# Patient Record
Sex: Female | Born: 1940
Health system: Southern US, Community
[De-identification: ages and names within clinical notes are randomized; demographics above are authoritative.]

## PROBLEM LIST (undated history)

## (undated) DIAGNOSIS — I82409 Acute embolism and thrombosis of unspecified deep veins of unspecified lower extremity: Secondary | ICD-10-CM

## (undated) DIAGNOSIS — F411 Generalized anxiety disorder: Secondary | ICD-10-CM

## (undated) DIAGNOSIS — E785 Hyperlipidemia, unspecified: Secondary | ICD-10-CM

## (undated) DIAGNOSIS — K635 Polyp of colon: Secondary | ICD-10-CM

## (undated) DIAGNOSIS — I10 Essential (primary) hypertension: Secondary | ICD-10-CM

## (undated) DIAGNOSIS — R0602 Shortness of breath: Secondary | ICD-10-CM

## (undated) DIAGNOSIS — D649 Anemia, unspecified: Secondary | ICD-10-CM

## (undated) DIAGNOSIS — J209 Acute bronchitis, unspecified: Secondary | ICD-10-CM

## (undated) DIAGNOSIS — D72828 Other elevated white blood cell count: Principal | ICD-10-CM

## (undated) HISTORY — DX: Acute bronchitis, unspecified: J20.9

## (undated) HISTORY — DX: Anemia, unspecified: D64.9

## (undated) HISTORY — PX: BREAST BIOPSY: SHX20

## (undated) HISTORY — DX: Generalized anxiety disorder: F41.1

## (undated) HISTORY — DX: Hyperlipidemia, unspecified: E78.5

## (undated) HISTORY — PX: CHOLECYSTECTOMY: SHX55

## (undated) HISTORY — PX: FOOT SURGERY: SHX648

## (undated) HISTORY — DX: Shortness of breath: R06.02

## (undated) HISTORY — DX: Polyp of colon: K63.5

## (undated) HISTORY — PX: HIATAL HERNIA REPAIR: SHX195

## (undated) HISTORY — DX: Essential (primary) hypertension: I10

## (undated) HISTORY — DX: Other elevated white blood cell count: D72.828

## (undated) HISTORY — DX: Acute embolism and thrombosis of unspecified deep veins of unspecified lower extremity: I82.409

---

## 1998-09-11 ENCOUNTER — Ambulatory Visit (HOSPITAL_COMMUNITY): Admission: RE | Admit: 1998-09-11 | Discharge: 1998-09-11 | Payer: Self-pay | Admitting: *Deleted

## 2000-10-07 ENCOUNTER — Inpatient Hospital Stay (HOSPITAL_COMMUNITY): Admission: EM | Admit: 2000-10-07 | Discharge: 2000-10-09 | Payer: Self-pay | Admitting: Emergency Medicine

## 2000-10-10 ENCOUNTER — Encounter: Payer: Self-pay | Admitting: Emergency Medicine

## 2000-10-10 ENCOUNTER — Emergency Department (HOSPITAL_COMMUNITY): Admission: EM | Admit: 2000-10-10 | Discharge: 2000-10-11 | Payer: Self-pay | Admitting: Emergency Medicine

## 2000-10-16 ENCOUNTER — Encounter: Payer: Self-pay | Admitting: Emergency Medicine

## 2000-10-17 ENCOUNTER — Inpatient Hospital Stay (HOSPITAL_COMMUNITY): Admission: EM | Admit: 2000-10-17 | Discharge: 2000-10-21 | Payer: Self-pay | Admitting: Emergency Medicine

## 2000-11-05 ENCOUNTER — Ambulatory Visit (HOSPITAL_BASED_OUTPATIENT_CLINIC_OR_DEPARTMENT_OTHER): Admission: RE | Admit: 2000-11-05 | Discharge: 2000-11-05 | Payer: Self-pay | Admitting: Internal Medicine

## 2000-11-18 ENCOUNTER — Encounter: Payer: Self-pay | Admitting: Family Medicine

## 2000-11-18 ENCOUNTER — Ambulatory Visit (HOSPITAL_COMMUNITY): Admission: RE | Admit: 2000-11-18 | Discharge: 2000-11-18 | Payer: Self-pay | Admitting: Family Medicine

## 2001-03-26 ENCOUNTER — Ambulatory Visit (HOSPITAL_COMMUNITY): Admission: RE | Admit: 2001-03-26 | Discharge: 2001-03-26 | Payer: Self-pay | Admitting: Internal Medicine

## 2001-04-02 ENCOUNTER — Other Ambulatory Visit: Admission: RE | Admit: 2001-04-02 | Discharge: 2001-04-02 | Payer: Self-pay | Admitting: *Deleted

## 2001-08-04 ENCOUNTER — Other Ambulatory Visit: Admission: RE | Admit: 2001-08-04 | Discharge: 2001-08-04 | Payer: Self-pay | Admitting: Obstetrics and Gynecology

## 2002-06-03 ENCOUNTER — Encounter: Admission: RE | Admit: 2002-06-03 | Discharge: 2002-06-03 | Payer: Self-pay | Admitting: *Deleted

## 2002-06-03 ENCOUNTER — Encounter: Payer: Self-pay | Admitting: *Deleted

## 2002-12-21 ENCOUNTER — Other Ambulatory Visit: Admission: RE | Admit: 2002-12-21 | Discharge: 2002-12-21 | Payer: Self-pay | Admitting: Obstetrics and Gynecology

## 2003-11-23 ENCOUNTER — Emergency Department (HOSPITAL_COMMUNITY): Admission: EM | Admit: 2003-11-23 | Discharge: 2003-11-23 | Payer: Self-pay | Admitting: Emergency Medicine

## 2003-11-23 ENCOUNTER — Inpatient Hospital Stay (HOSPITAL_COMMUNITY): Admission: AD | Admit: 2003-11-23 | Discharge: 2003-12-04 | Payer: Self-pay | Admitting: Radiology

## 2003-11-29 ENCOUNTER — Encounter: Payer: Self-pay | Admitting: Cardiology

## 2003-12-05 ENCOUNTER — Inpatient Hospital Stay (HOSPITAL_COMMUNITY): Admission: EM | Admit: 2003-12-05 | Discharge: 2003-12-09 | Payer: Self-pay

## 2003-12-06 ENCOUNTER — Encounter: Payer: Self-pay | Admitting: Cardiovascular Disease

## 2004-01-30 ENCOUNTER — Other Ambulatory Visit: Admission: RE | Admit: 2004-01-30 | Discharge: 2004-01-30 | Payer: Self-pay | Admitting: Obstetrics and Gynecology

## 2004-05-15 ENCOUNTER — Encounter: Admission: RE | Admit: 2004-05-15 | Discharge: 2004-05-15 | Payer: Self-pay | Admitting: Internal Medicine

## 2004-05-23 ENCOUNTER — Ambulatory Visit (HOSPITAL_COMMUNITY): Admission: RE | Admit: 2004-05-23 | Discharge: 2004-05-23 | Payer: Self-pay | Admitting: Urology

## 2004-07-02 ENCOUNTER — Encounter (HOSPITAL_COMMUNITY): Admission: RE | Admit: 2004-07-02 | Discharge: 2004-08-13 | Payer: Self-pay | Admitting: Endocrinology

## 2005-02-05 ENCOUNTER — Ambulatory Visit: Payer: Self-pay | Admitting: Internal Medicine

## 2005-06-12 ENCOUNTER — Ambulatory Visit: Payer: Self-pay | Admitting: Internal Medicine

## 2005-07-29 ENCOUNTER — Encounter: Admission: RE | Admit: 2005-07-29 | Discharge: 2005-07-29 | Payer: Self-pay | Admitting: Internal Medicine

## 2005-10-30 ENCOUNTER — Encounter: Admission: RE | Admit: 2005-10-30 | Discharge: 2005-10-30 | Payer: Self-pay | Admitting: Endocrinology

## 2005-11-13 ENCOUNTER — Ambulatory Visit: Payer: Self-pay | Admitting: Internal Medicine

## 2005-12-12 ENCOUNTER — Ambulatory Visit: Payer: Self-pay | Admitting: Internal Medicine

## 2006-01-23 ENCOUNTER — Ambulatory Visit: Payer: Self-pay | Admitting: Internal Medicine

## 2006-06-12 ENCOUNTER — Ambulatory Visit: Payer: Self-pay | Admitting: Internal Medicine

## 2006-12-07 ENCOUNTER — Ambulatory Visit: Payer: Self-pay | Admitting: Internal Medicine

## 2007-01-28 ENCOUNTER — Ambulatory Visit: Payer: Self-pay | Admitting: Internal Medicine

## 2007-02-08 ENCOUNTER — Ambulatory Visit (HOSPITAL_COMMUNITY): Admission: RE | Admit: 2007-02-08 | Discharge: 2007-02-08 | Payer: Self-pay | Admitting: Endocrinology

## 2007-02-09 ENCOUNTER — Ambulatory Visit: Payer: Self-pay | Admitting: Critical Care Medicine

## 2007-06-28 ENCOUNTER — Ambulatory Visit: Payer: Self-pay | Admitting: Internal Medicine

## 2007-08-26 DIAGNOSIS — F411 Generalized anxiety disorder: Secondary | ICD-10-CM

## 2007-08-26 DIAGNOSIS — R0602 Shortness of breath: Secondary | ICD-10-CM | POA: Insufficient documentation

## 2007-08-26 DIAGNOSIS — J209 Acute bronchitis, unspecified: Secondary | ICD-10-CM

## 2007-10-26 ENCOUNTER — Ambulatory Visit: Payer: Self-pay | Admitting: Internal Medicine

## 2007-11-18 HISTORY — PX: PARTIAL NEPHRECTOMY: SHX414

## 2007-11-24 ENCOUNTER — Ambulatory Visit (HOSPITAL_COMMUNITY): Admission: RE | Admit: 2007-11-24 | Discharge: 2007-11-24 | Payer: Self-pay | Admitting: Urology

## 2008-02-24 ENCOUNTER — Ambulatory Visit: Payer: Self-pay | Admitting: Internal Medicine

## 2008-05-11 ENCOUNTER — Ambulatory Visit (HOSPITAL_COMMUNITY): Admission: RE | Admit: 2008-05-11 | Discharge: 2008-05-11 | Payer: Self-pay | Admitting: *Deleted

## 2008-08-25 ENCOUNTER — Ambulatory Visit: Payer: Self-pay | Admitting: Internal Medicine

## 2009-04-03 ENCOUNTER — Ambulatory Visit: Payer: Self-pay | Admitting: Internal Medicine

## 2009-04-27 ENCOUNTER — Telehealth: Payer: Self-pay | Admitting: Internal Medicine

## 2009-08-29 ENCOUNTER — Encounter: Payer: Self-pay | Admitting: Adult Health

## 2009-08-29 ENCOUNTER — Ambulatory Visit: Payer: Self-pay | Admitting: Internal Medicine

## 2009-09-12 ENCOUNTER — Ambulatory Visit: Payer: Self-pay | Admitting: Internal Medicine

## 2009-09-16 IMAGING — CR DG BE W/ AIR HIGH DENSITY
15 of 24 series · 15 of 24 positions shown · IV contrast (agent unspecified)
Comparison: none

CLINICAL DATA: Rectal bleeding - incomplete colonoscopy.

AIR-CONTRAST BARIUM ENEMA
TECHNIQUE: After obtaining a scout radiograph air-contrast barium
enema was performed under fluoroscopy using high-density barium.
Fluoroscopy Time: No priors for comparison.
Contrast: T air contrast.

[run (1 of 12)]
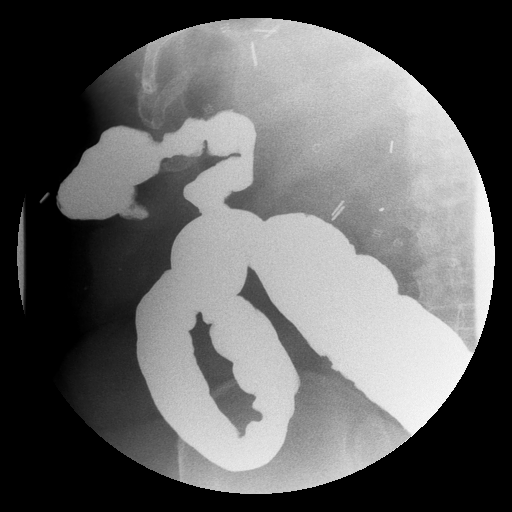

[run (2 of 12)]
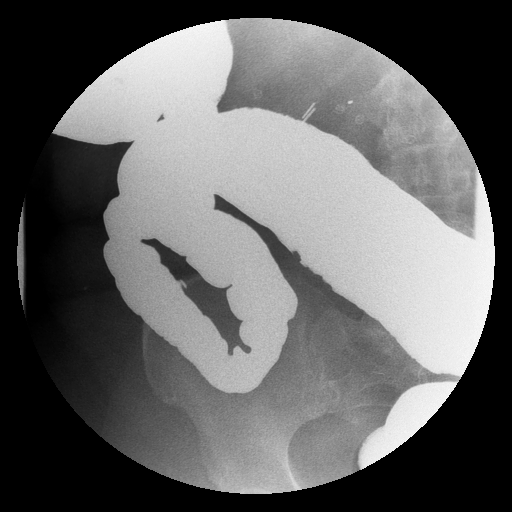

[run (3 of 12)]
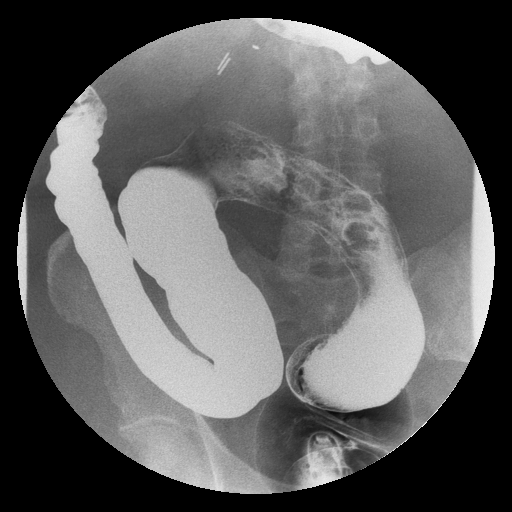

[run (4 of 12)]
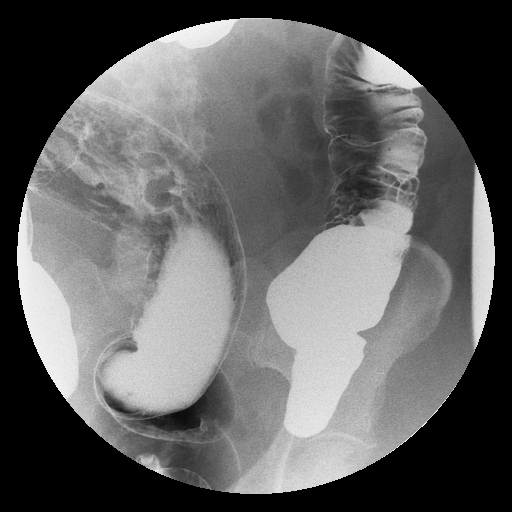

[run (5 of 12)]
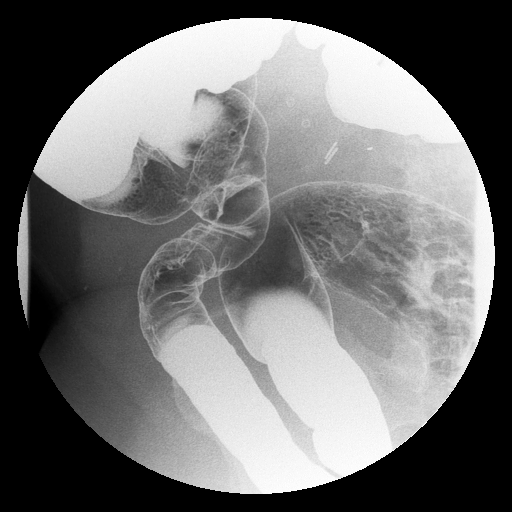

[run (6 of 12)]
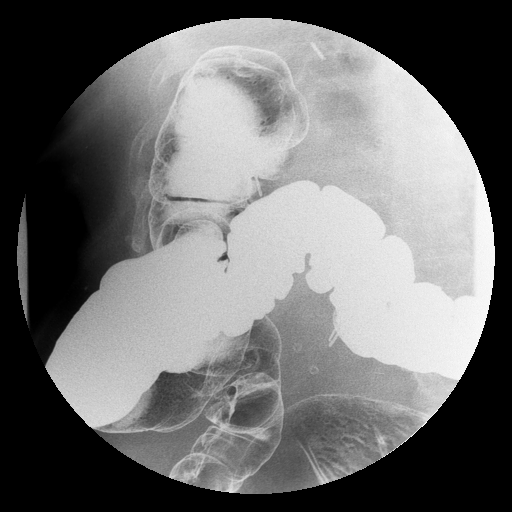

[run (7 of 12)]
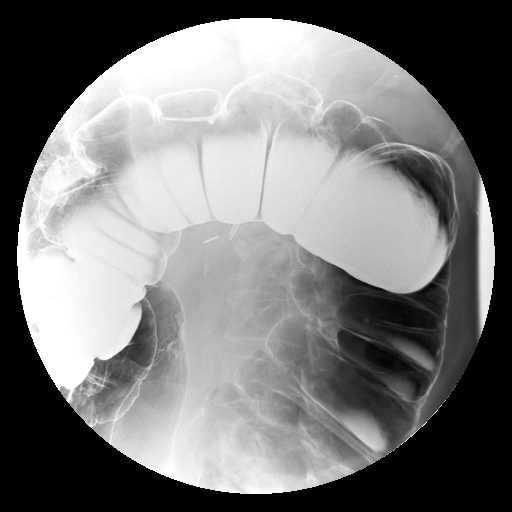

[run (8 of 12)]
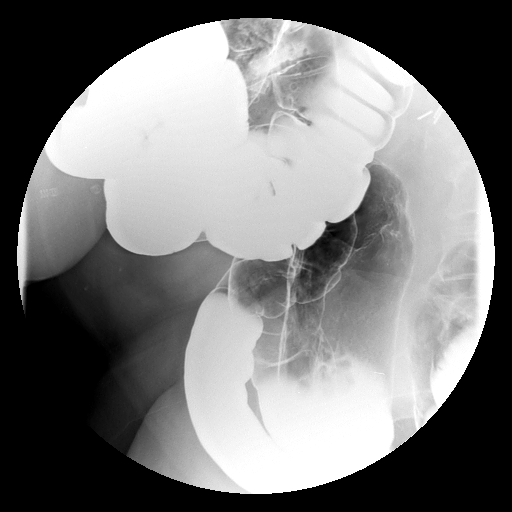

[run (9 of 12)]
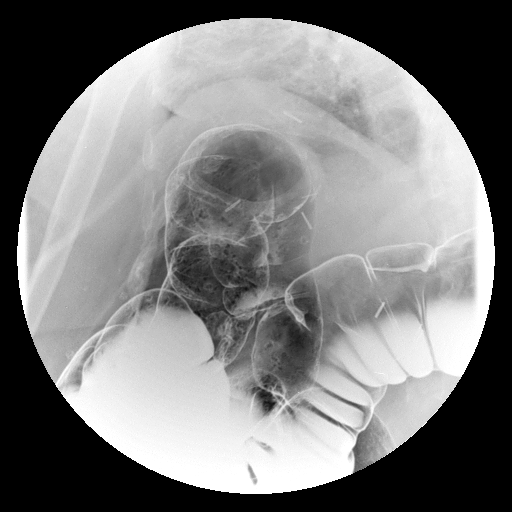

[run (10 of 12)]
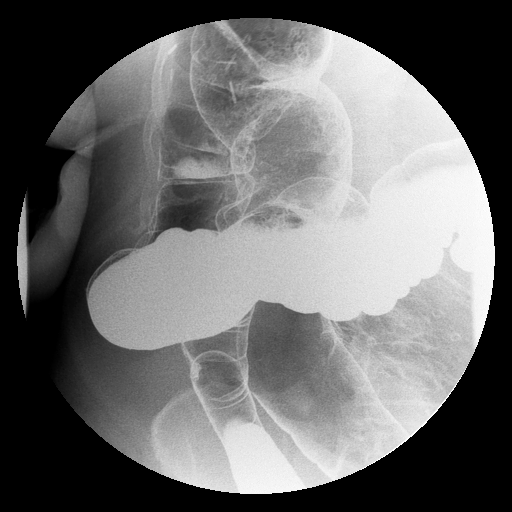

[run (11 of 12)]
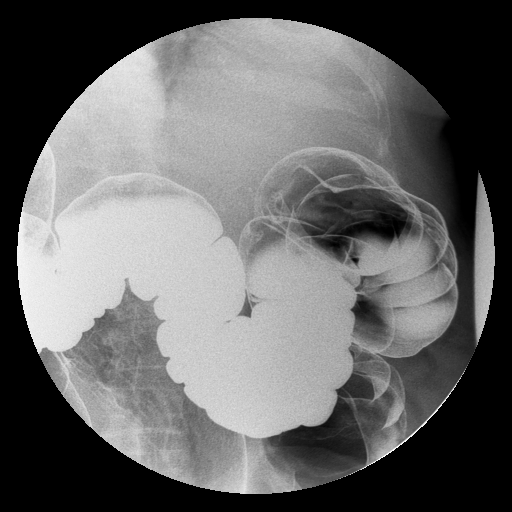

[run (12 of 12)]
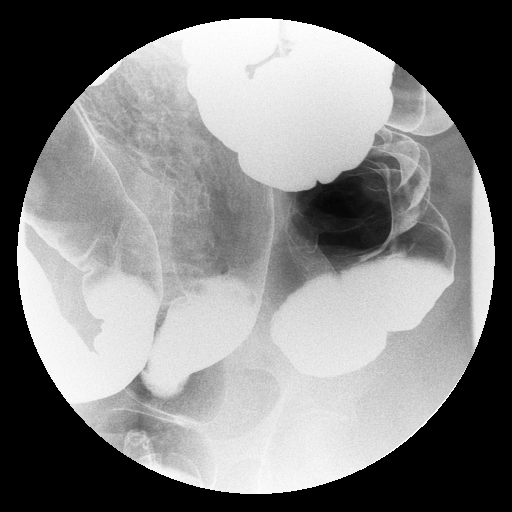

[view not recorded (1 of 3)]
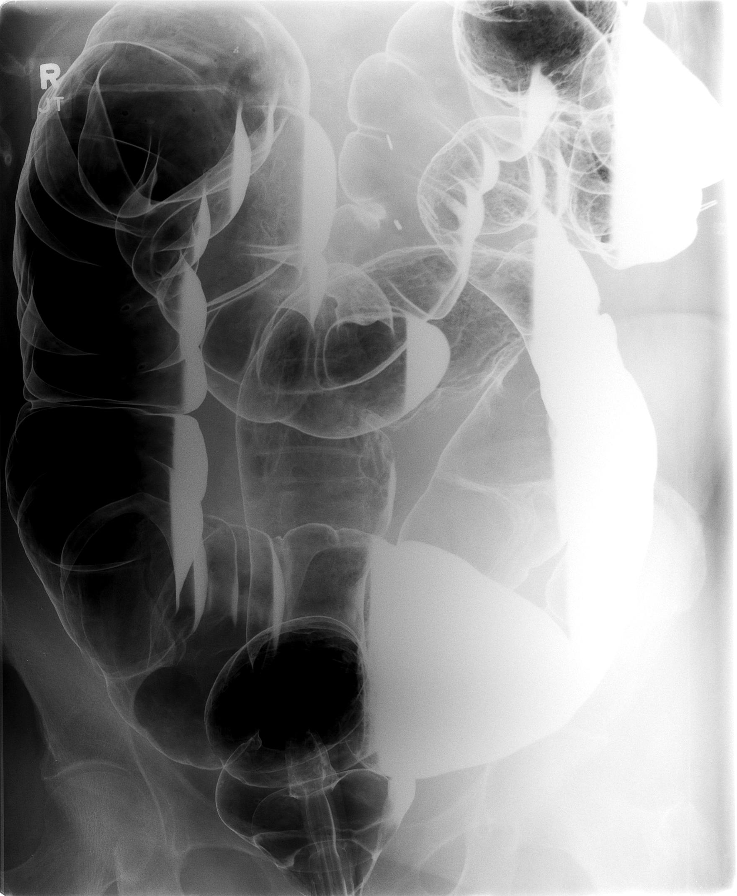

[view not recorded (2 of 3)]
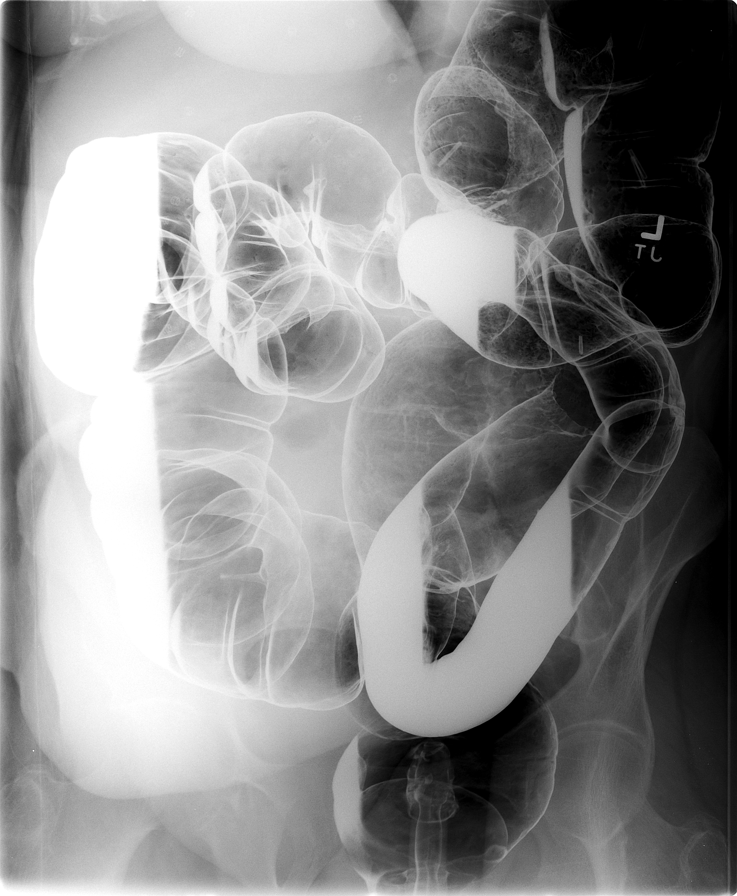

[view not recorded (3 of 3)]
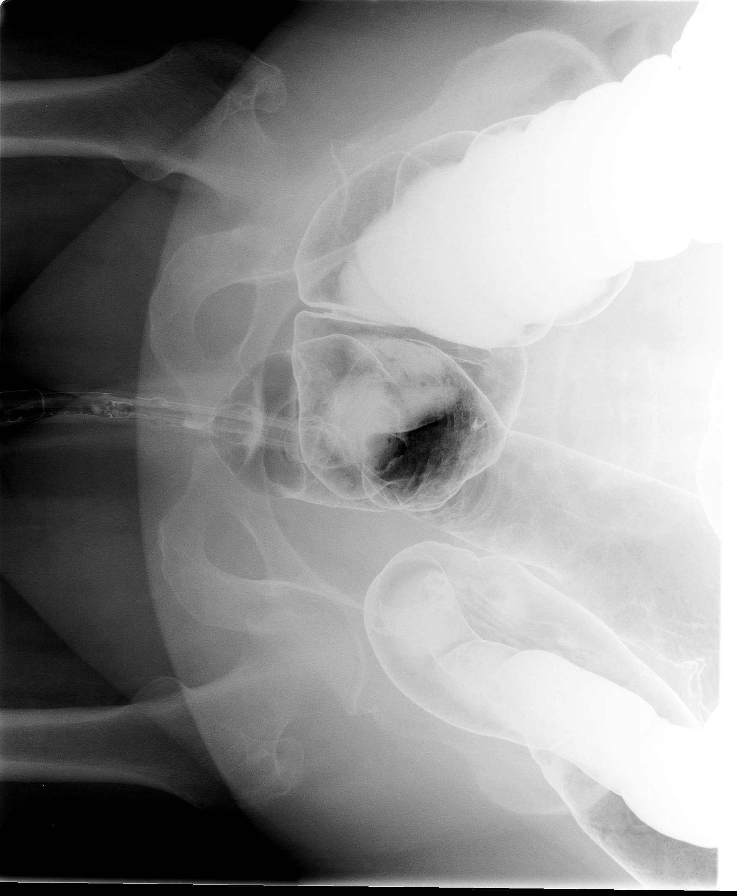

[15 of 24 positions shown; findings below may reference images not displayed]

FINDINGS: Scout film unremarkable except for changes related to
ventral hernia repair in the epigastrium. The left colon is quite
redundant and tortuous.  I was successful in filling the entire
colon with contrast.

The study is limited somewhat by a markedly tortuous colon.
However, on multiple spot films and overhead views, there appears
to be.  Abnormal mucosa in the sigmoid colon, over a long segment
of bowel.  Large polyps and/or mucosal infiltration cannot be
excluded.  This is not appear to be feces but could conceivably be.
Believe abnormality persist on the postevacuation images. I
discussed these findings with Dr. Danii Aujla .

The remainder of the colon is unremarkable, given the limitations
due to the marked tortuosity. There is filling of the appendix.  No
reflux into the terminal ileum.
IMPRESSION: ]
1.  The exam is somewhat limited technically due to a markedly
tortuous colon.
2.  Cannot rule out pathology in the sigmoid colon - see report.

## 2009-10-01 ENCOUNTER — Ambulatory Visit: Payer: Self-pay | Admitting: Internal Medicine

## 2009-11-17 HISTORY — PX: COLONOSCOPY: SHX174

## 2010-04-22 ENCOUNTER — Ambulatory Visit: Payer: Self-pay | Admitting: Internal Medicine

## 2010-05-14 ENCOUNTER — Encounter: Payer: Self-pay | Admitting: Internal Medicine

## 2010-07-24 ENCOUNTER — Telehealth: Payer: Self-pay | Admitting: Internal Medicine

## 2010-09-26 ENCOUNTER — Ambulatory Visit: Payer: Self-pay | Admitting: Internal Medicine

## 2010-11-12 ENCOUNTER — Ambulatory Visit (HOSPITAL_COMMUNITY)
Admission: RE | Admit: 2010-11-12 | Discharge: 2010-11-12 | Payer: Self-pay | Source: Home / Self Care | Attending: Obstetrics and Gynecology | Admitting: Obstetrics and Gynecology

## 2010-12-19 NOTE — Assessment & Plan Note (Signed)
Summary: rov 6 months///kp   Primary Provider/Referring Provider:  Dr Juleen China  CC:  6 month follow up  , increase sob with exertion, and increase swelling in ankles.  History of Present Illness:  August 29, 2009--Presents for an acute office visit. Complains of 2 weeks of progressively worsened cough, congestion w/ thick yellow mucus, Hard to get up, Wheezing is getting worse. No otc used. Denies chest pain,  orthopnea, hemoptysis, fever, n/v/d, edema, headache.  September 12, 2009--Acute Visit.  Pt complains of  "feels like fluid has built back up on lungs."  c/o dry cough, wheezing, inc SOB at rest and with activity, sweating spells, and aching pain in lower back/bilat rib cages.  Pt states sxs started x1wk ago. Denies chest pain,  orthopnea, hemoptysis, fever, n/v/d,  headache.   October 01, 2009-  Asthmatic bronchitis, hx DVT, hx fundoplication, DM She feels well now aftrer acute visit late October. Had flu vax. Spits up a little yellow, but otherwise not wheezing and no significant cough. Rare "streak of fire" chest pain across upper anterior chest, no pattern recognized.  05-06-2010- Asthmatic bronchitis, hx DVT, hx fundoplication, DM Hot weather smothers her. Cough is eased by using diuretic. Denies chest pain. Rarely coughs clear mucus. Feet swell .Denies wheeze. CXR last Fall was clear/ NAD.    Preventive Screening-Counseling & Management  Alcohol-Tobacco     Smoking Status: quit > 6 months  Current Medications (verified): 1)  Singulair 10 Mg  Tabs (Montelukast Sodium) .... Take 1 By Mouth Once Daily 2)  Protonix 40 Mg  Pack (Pantoprazole Sodium) .... Take 1 By Mouth Once Daily 3)  Paxil Cr 37.5 Mg  Tb24 (Paroxetine Hcl) .... Take 1 By Mouth Once Daily 4)  Actonel 35 Mg  Tabs (Risedronate Sodium) .... Take 1 By Mouth Weekly 5)  Advair Diskus 100-50 Mcg/dose  Misc (Fluticasone-Salmeterol) .Marland Kitchen.. 1 Puff Two Times A Day and Rinse Mouth 6)  Hydrochlorothiazide 25 Mg  Tabs  (Hydrochlorothiazide) .... Take 1 By Mouth Once Daily 7)  Crestor 5 Mg  Tabs (Rosuvastatin Calcium) .... Take  1 By Mouth Once Daily 8)  Flexeril 10 Mg  Tabs (Cyclobenzaprine Hcl) .... Take 1 By Mouth Once Daily As Needed 9)  Spiriva Handihaler 18 Mcg  Caps (Tiotropium Bromide Monohydrate) .... Inhale Contents of 1 Capsule Once A Day 10)  Lorazepam 0.5 Mg  Tabs (Lorazepam) .... Take 1 By Mouth Three Times A Day As Needed 11)  Adult Aspirin Low Strength 81 Mg  Tbdp (Aspirin) .... Take 1 By Mouth Once Daily 12)  Lasix 20 Mg  Tabs (Furosemide) .... Take 1 By Mouth Once Daily As Needed 13)  Proair Hfa 108 (90 Base) Mcg/act Aers (Albuterol Sulfate) .... 2 Puffs Four Times A Day As Needed 14)  Bl Potassium 99 Mg  Tabs (Potassium) .... Take 1 By Mouth Once Daily  Allergies (verified): No Known Drug Allergies  Past History:  Past Medical History: Last updated: 02/24/2008 * MULTIPLE SOMATIC COMPLAINS * Hx of DEEP VEIN THROMBOSIS Hx of NISSEN FUNDOPLICATION, HX OF (ICD-V15.2) NEPHRECTOMY, HX OF LEFT (ICD-V45.73) ANXIETY DISORDER, GENERALIZED (ICD-300.02) ASTHMATIC BRONCHITIS, ACUTE (ICD-466.0) DYSPNEA (ICD-786.05)  Past Surgical History: Last updated: 10/01/2009 Hiatal hernia repair x 2 Partial left nephrectomy for cancer Cholecystectomy left breast biopsy, beningn foot surgery left chest tube for effusion after hiatal hernia surgery  Family History: Last updated: 2010/05/06 Father- died MI Mother-died CHF Brother- CAD/Stent/CABG/ MI, DM. Died esophageal perforation after endoscopy.  Social History: Last updated:  09/12/2009 Patient states former smoker.    Never married, no children  Risk Factors: Smoking Status: quit > 6 months (04/22/2010)  Family History: Father- died MI Mother-died CHF Brother- CAD/Stent/CABG/ MI, DM. Died esophageal perforation after endoscopy.  Social History: Smoking Status:  quit > 6 months  Review of Systems      See HPI       The patient  complains of shortness of breath with activity and non-productive cough.  The patient denies shortness of breath at rest, productive cough, coughing up blood, chest pain, irregular heartbeats, acid heartburn, indigestion, loss of appetite, weight change, abdominal pain, difficulty swallowing, sore throat, tooth/dental problems, headaches, nasal congestion/difficulty breathing through nose, and sneezing.    Vital Signs:  Patient profile:   70 year old female Height:      62 inches Weight:      207 pounds BMI:     38.00 O2 Sat:      95 % on Room air Pulse rate:   92 / minute BP sitting:   130 / 745  (left arm)  Vitals Entered By: Renold Genta RCP, LPN (April 22, 6294 2:37 PM)  O2 Flow:  Room air CC: 6 month follow up  ,increase sob with exertion, increase swelling in ankles Comments Medications reviewed with patient Renold Genta RCP, LPN  April 22, 2840 2:39 PM    Physical Exam  Additional Exam:  General: A/Ox3; pleasant and cooperative, NAD, obese SKIN: no rash, lesions NODES: no lymphadenopathy HEENT: [place]/AT, EOM- WNL, Conjuctivae- clear, PERRLA, TM-WNL, Nose- clear, Throat- clear and wnl, dentures, Mallampati III NECK: Supple w/ fair ROM, JVD- none, normal carotid impulses w/o bruits Thyroid-  CHEST: Quiet without wheeze, cough or rhonchi HEART: RRR, no m/g/r heard ABDOMEN: soft LKG:MWNU, nl pulses, trace edema. bilateral varices- soft NEURO: Grossly intact to observation      Impression & Recommendations:  Problem # 1:  DYSPNEA (ICD-786.05) Multifactorial dyspnea with components ofobesity hypoventilation, deconditioning, and uncertain component of variable pulmonary edema she describes as responsive to diuretic. We will get PFT  Problem # 2:  Hx of DEEP VENOUS THROMBOPHLEBITIS, HX OF (ICD-V12.52)  We discussed elevation to prevent blood flow stagnation. She denies pain in legs. Her updated medication list for this problem includes:    Adult Aspirin Low Strength 81  Mg Tbdp (Aspirin) .Marland Kitchen... Take 1 by mouth once daily  Other Orders: Est. Patient Level IV (27253)  Patient Instructions: 1)  Please schedule a follow-up appointment in 3 months. 2)  Schedule PFT with 6 MWT

## 2010-12-19 NOTE — Miscellaneous (Signed)
Summary: Orders Update  Clinical Lists Changes  Orders: Added new Service order of No Show NS50 (NS50) - Signed 

## 2010-12-19 NOTE — Miscellaneous (Signed)
Summary: Orders Update pft charges  Clinical Lists Changes  Orders: Added new Service order of Carbon Monoxide diffusing w/capacity (94720) - Signed Added new Service order of Lung Volumes (94240) - Signed Added new Service order of Spirometry (Pre & Post) (94060) - Signed 

## 2010-12-19 NOTE — Assessment & Plan Note (Signed)
Summary: ROV AFTER SMW/PFT ///KP   Primary Provider/Referring Provider:  Dr Juleen China  CC:  follow up visit Slight SOB after tests; review PFT's and .Marland Kitchen  History of Present Illness: September 12, 2009--Acute Visit.  Pt complains of  "feels like fluid has built back up on lungs."  c/o dry cough, wheezing, inc SOB at rest and with activity, sweating spells, and aching pain in lower back/bilat rib cages.  Pt states sxs started x1wk ago. Denies chest pain,  orthopnea, hemoptysis, fever, n/v/d,  headache.   October 01, 2009-  Asthmatic bronchitis, hx DVT, hx fundoplication, DM She feels well now aftrer acute visit late October. Had flu vax. Spits up a little yellow, but otherwise not wheezing and no significant cough. Rare "streak of fire" chest pain across upper anterior chest, no pattern recognized.  April 22, 2010- Asthmatic bronchitis, hx DVT, hx fundoplication, DM Hot weather smothers her. Cough is eased by using diuretic. Denies chest pain. Rarely coughs clear mucus. Feet swell .Denies wheeze. CXR last Fall was clear/ NAD.   September 26, 2010- Asthmatic bronchitis, hx DVT, hx fundoplication, DM Nurse-CC: follow up visit Slight SOB after tests; review PFT's and . Needs flu vax. Only one pneumovax over 10 years ago.  Using Advair once daily  and Sopiriva. Never needs her Proair rescue inhaler. Occasional chest tightnss is relieved by taking her lasix as needed. Denies recent heartburn. Told once she had some heart failure at hospital- unclear when. Fire ant stings recently- shows scabs on legs..  Having evaluation for gyn bleeding.  PFT- WNL - 97%, 97%, 99%  414 meters.    Preventive Screening-Counseling & Management  Alcohol-Tobacco     Smoking Status: quit > 6 months  Comments: Only smoked occasional cigar her grandfather gave her when she was a child. Never smoked cigarettes.   Current Medications (verified): 1)  Singulair 10 Mg  Tabs (Montelukast Sodium) .... Take 1 By  Mouth Once Daily 2)  Protonix 40 Mg  Pack (Pantoprazole Sodium) .... Take 1 By Mouth Once Daily 3)  Paxil Cr 37.5 Mg  Tb24 (Paroxetine Hcl) .... Take 1 By Mouth Once Daily 4)  Actonel 35 Mg  Tabs (Risedronate Sodium) .... Take 1 By Mouth Weekly 5)  Advair Diskus 100-50 Mcg/dose  Misc (Fluticasone-Salmeterol) .Marland Kitchen.. 1 Puff Two Times A Day and Rinse Mouth 6)  Hydrochlorothiazide 25 Mg  Tabs (Hydrochlorothiazide) .... Take 1 By Mouth Once Daily 7)  Crestor 5 Mg  Tabs (Rosuvastatin Calcium) .... Take  1 By Mouth Once Daily 8)  Flexeril 10 Mg  Tabs (Cyclobenzaprine Hcl) .... Take 1 By Mouth Once Daily As Needed 9)  Spiriva Handihaler 18 Mcg  Caps (Tiotropium Bromide Monohydrate) .... Inhale Contents of 1 Capsule Once A Day 10)  Lorazepam 0.5 Mg  Tabs (Lorazepam) .... Take 1 By Mouth Three Times A Day As Needed 11)  Adult Aspirin Low Strength 81 Mg  Tbdp (Aspirin) .... Take 1 By Mouth Once Daily 12)  Lasix 20 Mg  Tabs (Furosemide) .... Take 1 By Mouth Once Daily As Needed 13)  Proair Hfa 108 (90 Base) Mcg/act Aers (Albuterol Sulfate) .... 2 Puffs Four Times A Day As Needed 14)  Bl Potassium 99 Mg  Tabs (Potassium) .... Take 1 By Mouth Once Daily  Allergies (verified): No Known Drug Allergies  Past History:  Past Medical History: Last updated: 02/24/2008 * MULTIPLE SOMATIC COMPLAINS * Hx of DEEP VEIN THROMBOSIS Hx of NISSEN FUNDOPLICATION, HX OF (ICD-V15.2) NEPHRECTOMY, HX OF LEFT (  ICD-V45.73) ANXIETY DISORDER, GENERALIZED (ICD-300.02) ASTHMATIC BRONCHITIS, ACUTE (ICD-466.0) DYSPNEA (ICD-786.05)  Past Surgical History: Last updated: 10/01/2009 Hiatal hernia repair x 2 Partial left nephrectomy for cancer Cholecystectomy left breast biopsy, beningn foot surgery left chest tube for effusion after hiatal hernia surgery  Family History: Last updated: 2010/05/04 Father- died MI Mother-died CHF Brother- CAD/Stent/CABG/ MI, DM. Died esophageal perforation after endoscopy.  Social  History: Last updated: 09/26/2010 Patient states former smoker.    Never married, no children Has dog.  Risk Factors: Smoking Status: quit > 6 months (09/26/2010)  Social History: Patient states former smoker.    Never married, no children Has dog.  Review of Systems      See HPI  The patient denies shortness of breath with activity, shortness of breath at rest, productive cough, non-productive cough, coughing up blood, chest pain, irregular heartbeats, acid heartburn, indigestion, loss of appetite, weight change, abdominal pain, difficulty swallowing, sore throat, tooth/dental problems, headaches, nasal congestion/difficulty breathing through nose, and sneezing.    Vital Signs:  Patient profile:   70 year old female Height:      62 inches Weight:      200 pounds BMI:     36.71 O2 Sat:      100 % on Room air Pulse rate:   95 / minute BP sitting:   108 / 68  (left arm) Cuff size:   large  Vitals Entered By: Reynaldo Minium CMA (September 26, 2010 10:29 AM)  O2 Flow:  Room air CC: follow up visit Slight SOB after tests; review PFT's and .   Physical Exam  Additional Exam:  General: A/Ox3; pleasant and cooperative, NAD, obese SKIN: Multiple fire ant bites on lower legs.  NODES: no lymphadenopathy HEENT: [place]/AT, EOM- WNL, Conjuctivae- clear, PERRLA, TM-WNL, Nose- clear, Throat- clear and wnl, dentures, Mallampati III NECK: Supple w/ fair ROM, JVD- none, normal carotid impulses w/o bruits Thyroid-  CHEST: Quiet without wheeze, cough or rhonchi HEART: RRR, no m/g/r heard ABDOMEN: soft EXB:MWUX, nl pulses, trace edema. bilateral varices- soft. Negative Homan's.  NEURO: Grossly intact to observation      Impression & Recommendations:  Problem # 1:  Hx of DEEP VENOUS THROMBOPHLEBITIS, HX OF (ICD-V12.52)  Heavy legs with negative Homan's, prominent superficial veins. Now covered with what she reports were fire ant stings- scabs w/o inflammation..  Her updated medication  list for this problem includes:    Adult Aspirin Low Strength 81 Mg Tbdp (Aspirin) .Marland Kitchen... Take 1 by mouth once daily  Problem # 2:  DYSPNEA (ICD-786.05)  obesity hypoventilation and intermittent bronchitis. Excellent PFT and 6 minute walk test results in this never smoker. I don't anticipate pulmonary limitations if she should need gyn surgery.   Problem # 3:  Hx of GERD (ICD-530.81) She is not having active reflux symptoms, but i reminded her of basic reflux precautions. She did have fundoplication in the past.  Her updated medication list for this problem includes:    Protonix 40 Mg Pack (Pantoprazole sodium) .Marland Kitchen... Take 1 by mouth once daily  Other Orders: Est. Patient Level III (32440) Influenza Vaccine MCR (10272) Pneumococcal Vaccine (53664) Admin 1st Vaccine (40347)  Patient Instructions: 1)  Flu and pneumovax.  2)  Please schedule a follow-up appointment in 1 year.   Immunizations Administered:  Influenza Vaccine # 1:    Vaccine Type: Fluvax MCR    Site: left deltoid    Mfr: Nov[employer]is    Dose: 0.5 ml    Route: IM  Given by: Reynaldo Minium CMA    Exp. Date: 04/18/2011    Lot #: [id number]    VIS given: 06/11/10 version given September 26, 2010.  Pneumonia Vaccine:    Vaccine Type: Pneumovax (Medicare)    Site: right deltoid    Mfr: Merck    Dose: 0.5 ml    Route: IM    Given by: Reynaldo Minium CMA    Exp. Date: 03/13/2012    Lot #: [id number]    VIS given: 10/22/09 version given September 26, 2010.  Flu Vaccine Consent Questions:    Do you have a history of severe allergic reactions to this vaccine? no    Any prior history of allergic reactions to egg and/or gelatin? no    Do you have a sensitivity to the preservative Thimersol? no    Do you have a past history of Guillan-Barre Syndrome? no    Do you currently have an acute febrile illness? no    Have you ever had a severe reaction to latex? no    Vaccine information given and explained to patient? yes    Are you  currently pregnant? no

## 2010-12-19 NOTE — Assessment & Plan Note (Signed)
Summary: SIX MIN WALK- PULM STRESS TEST  Nurse Visit   Vital Signs:  Patient profile:   70 year old female Pulse rate:   78 / minute BP sitting:   124 / 68  Medications Prior to Update: 1)  Singulair 10 Mg  Tabs (Montelukast Sodium) .... Take 1 By Mouth Once Daily 2)  Protonix 40 Mg  Pack (Pantoprazole Sodium) .... Take 1 By Mouth Once Daily 3)  Paxil Cr 37.5 Mg  Tb24 (Paroxetine Hcl) .... Take 1 By Mouth Once Daily 4)  Actonel 35 Mg  Tabs (Risedronate Sodium) .... Take 1 By Mouth Weekly 5)  Advair Diskus 100-50 Mcg/dose  Misc (Fluticasone-Salmeterol) .Marland Kitchen.. 1 Puff Two Times A Day and Rinse Mouth 6)  Hydrochlorothiazide 25 Mg  Tabs (Hydrochlorothiazide) .... Take 1 By Mouth Once Daily 7)  Crestor 5 Mg  Tabs (Rosuvastatin Calcium) .... Take  1 By Mouth Once Daily 8)  Flexeril 10 Mg  Tabs (Cyclobenzaprine Hcl) .... Take 1 By Mouth Once Daily As Needed 9)  Spiriva Handihaler 18 Mcg  Caps (Tiotropium Bromide Monohydrate) .... Inhale Contents of 1 Capsule Once A Day 10)  Lorazepam 0.5 Mg  Tabs (Lorazepam) .... Take 1 By Mouth Three Times A Day As Needed 11)  Adult Aspirin Low Strength 81 Mg  Tbdp (Aspirin) .... Take 1 By Mouth Once Daily 12)  Lasix 20 Mg  Tabs (Furosemide) .... Take 1 By Mouth Once Daily As Needed 13)  Proair Hfa 108 (90 Base) Mcg/act Aers (Albuterol Sulfate) .... 2 Puffs Four Times A Day As Needed 14)  Bl Potassium 99 Mg  Tabs (Potassium) .... Take 1 By Mouth Once Daily  Allergies: No Known Drug Allergies  Orders Added: 1)  Pulmonary Stress (6 min walk) [94620]   Six Minute Walk Test Medications taken before test(dose and time): 1)  Singulair 10 Mg  Tabs (Montelukast Sodium) .... Take 1 By Mouth Once Daily 2)  Protonix 40 Mg  Pack (Pantoprazole Sodium) .... Take 1 By Mouth Once Daily 3)  Paxil Cr 37.5 Mg  Tb24 (Paroxetine Hcl) .... Take 1 By Mouth Once Daily 4)  Actonel 35 Mg  Tabs (Risedronate Sodium) .... Take 1 By Mouth Weekly 5)  Advair Diskus 100-50 Mcg/dose   Misc (Fluticasone-Salmeterol) .Marland Kitchen.. 1 Puff Two Times A Day and Rinse Mouth 6)  Hydrochlorothiazide 25 Mg  Tabs (Hydrochlorothiazide) .... Take 1 By Mouth Once Daily 7)  Crestor 5 Mg  Tabs (Rosuvastatin Calcium) .... Take  1 By Mouth Once Daily 8)  Flexeril 10 Mg  Tabs (Cyclobenzaprine Hcl) .... Take 1 By Mouth Once Daily As Needed 9)  Spiriva Handihaler 18 Mcg  Caps (Tiotropium Bromide Monohydrate) .... Inhale Contents of 1 Capsule Once A Day 11)  Adult Aspirin Low Strength 81 Mg  Tbdp (Aspirin) .... Take 1 By Mouth Once Daily 12)  Lasix 20 Mg  Tabs (Furosemide) .... Take 1 By Mouth Once Daily As Needed 13)  Proair Hfa 108 (90 Base) Mcg/act Aers (Albuterol Sulfate) .... 2 Puffs Four Times A Day As Needed 14)  Bl Potassium 99 Mg  Tabs (Potassium) .... Take 1 By Mouth Once Daily Pt took these meds today "before 8am" Supplemental oxygen during the test: No  Lap counter(place a tick mark inside a square for each lap completed) lap 1 complete  lap 2 complete   lap 3 complete   lap 4 complete  lap 5 complete  lap 6 complete  lap 7 complete   lap 8 complete  Baseline  BP sitting: 124/ 68 Heart rate: 78 Dyspnea ( Borg scale) 1 Fatigue (Borg scale) 0 SPO2 97  End Of Test  BP sitting: 132/ 72 Heart rate: 108 Dyspnea ( Borg scale) 4 Fatigue (Borg scale) 4 SPO2 97  2 Minutes post  BP sitting: 122/ 70 Heart rate: 83 SPO2 99  Stopped or paused before six minutes? No  Interpretation: Number of laps  8 X 48 meters =   384 meters+ final partial lap: 30 meters =    414 meters   Total distance walked in six minutes: 414 meters  Tech ID: Tivis Ringer, CNA (September 26, 2010 9:29 AM) Jeremy Johann Comments Pt completed test w/ 0 rest breaks and 1 complaint: SOB.

## 2010-12-19 NOTE — Progress Notes (Signed)
Summary: nos appt  Phone Note Call from Patient   Caller: [email or web address]  Call For: Everson Mott Summary of Call: [facility]sc nos from 9/6 to 11/10 @ 11a. Initial call taken by: Darletta Moll,  July 24, 2010 8:51 AM

## 2011-01-16 ENCOUNTER — Ambulatory Visit: Payer: Self-pay | Admitting: Adult Health

## 2011-01-27 LAB — BASIC METABOLIC PANEL
BUN: 14 mg/dL (ref 6–23)
CO2: 30 mEq/L (ref 19–32)
Calcium: 9.5 mg/dL (ref 8.4–10.5)
Chloride: 101 mEq/L (ref 96–112)
Creatinine, Ser: 0.91 mg/dL (ref 0.4–1.2)
GFR calc Af Amer: >60 mL/min (ref >60)
GFR calc non Af Amer: >60 mL/min (ref >60)
Glucose, Bld: 108 mg/dL — ABNORMAL HIGH (ref 70–99)
Potassium: 3.5 mEq/L (ref 3.5–5.1)
Sodium: 137 mEq/L (ref 135–145)

## 2011-01-27 LAB — CBC
HCT: 34.5 % — ABNORMAL LOW (ref 36.0–46.0)
Hemoglobin: 10.8 g/dL — ABNORMAL LOW (ref 12.0–15.0)
MCH: 25 pg — ABNORMAL LOW (ref 26.0–34.0)
MCHC: 31.3 g/dL (ref 30.0–36.0)
MCV: 79.9 fL (ref 78.0–100.0)
Platelets: 194 10*3/uL (ref 150–400)
RBC: 4.32 MIL/uL (ref 3.87–5.11)
RDW: 15.6 % — ABNORMAL HIGH (ref 11.5–15.5)
WBC: 7.7 10*3/uL (ref 4.0–10.5)

## 2011-01-27 LAB — GLUCOSE, CAPILLARY
Glucose-Capillary: 101 mg/dL — ABNORMAL HIGH (ref 70–99)
Glucose-Capillary: 96 mg/dL (ref 70–99)

## 2011-01-28 ENCOUNTER — Ambulatory Visit (INDEPENDENT_AMBULATORY_CARE_PROVIDER_SITE_OTHER): Payer: Medicare Other | Admitting: Internal Medicine

## 2011-01-28 ENCOUNTER — Encounter: Payer: Self-pay | Admitting: Internal Medicine

## 2011-01-28 DIAGNOSIS — J45909 Unspecified asthma, uncomplicated: Secondary | ICD-10-CM

## 2011-01-28 DIAGNOSIS — F411 Generalized anxiety disorder: Secondary | ICD-10-CM | POA: Insufficient documentation

## 2011-01-28 DIAGNOSIS — J45998 Other asthma: Secondary | ICD-10-CM | POA: Insufficient documentation

## 2011-01-30 ENCOUNTER — Telehealth (INDEPENDENT_AMBULATORY_CARE_PROVIDER_SITE_OTHER): Payer: Self-pay | Admitting: *Deleted

## 2011-01-30 DIAGNOSIS — Z8672 Personal history of thrombophlebitis: Secondary | ICD-10-CM | POA: Insufficient documentation

## 2011-01-30 DIAGNOSIS — K219 Gastro-esophageal reflux disease without esophagitis: Secondary | ICD-10-CM | POA: Insufficient documentation

## 2011-01-30 DIAGNOSIS — I10 Essential (primary) hypertension: Secondary | ICD-10-CM | POA: Insufficient documentation

## 2011-01-30 DIAGNOSIS — J309 Allergic rhinitis, unspecified: Secondary | ICD-10-CM | POA: Insufficient documentation

## 2011-02-04 NOTE — Assessment & Plan Note (Signed)
Summary: chest congestion ///kp   CC:  Acute Visit.  c/o increased SOB, chest congestion, prod cough with thick white mucus, and chest tightness x several weeks.  Marland Kitchen  History of Present Illness: January 28, 2011- Asthma/ bronchitis, Anxiety, Hx DVT................ Sister here ( much of old charting unavailable today pending reconciliation of multiple MRNs) Nurse-CC: Acute Visit.  c/o increased SOB, chest congestion, prod cough with thick white mucus, chest tightness x several weeks.   Acute visit- 6 weeks increased head and chest congestion, may have begun with a cold. Went to Urgent Care and then to [facility] for shots, cxr, labs. Rx'd antibiotics, cough syrups, CT r/o'd VTE. Her sister reports that she has cleared and worsened intermittently through this time. Nebulizer treatments make her nervous and itching. She has script lorazepam. Cough now w/ thick white mucus, chest tightness x several weeks. She has gone to urgent care, ER and her PCP. Hospital records from [place] [facility] were reviewed.   Preventive Screening-Counseling & Management  Alcohol-Tobacco     Smoking Status: never  Current Medications (verified): 1)  Advair Diskus 100-50 Mcg/dose Aepb (Fluticasone-Salmeterol) .Marland Kitchen.. 1 Puff Two Times A Day 2)  Albuterol Sulfate (2.5 Mg/63ml) 0.083% Nebu (Albuterol Sulfate) .... Every 4 Hours 3)  Vicodin 5-500 Mg Tabs (Hydrocodone-Acetaminophen) .... Take 1 Tablet By Mouth Four Times A Day As Needed Pain 4)  Proair Hfa 108 (90 Base) Mcg/act Aers (Albuterol Sulfate) .... As Needed 5)  Spiriva Handihaler 18 Mcg Caps (Tiotropium Bromide Monohydrate) .... Once Daily 6)  Lorazepam 0.5 Mg Tabs (Lorazepam) .... Take 1 Tablet By Mouth Three Times A Day As Needed 7)  Singulair 10 Mg Tabs (Montelukast Sodium) .... Take 1 Tab By Mouth At Bedtime 8)  Omeprazole 20 Mg Cpdr (Omeprazole) .... Take 1 Capsule By Mouth Once A Day 9)  Klor-Con M20 20 Meq Cr-Tabs (Potassium Chloride Crys Cr) .... Take 1  Tablet By Mouth Once A Day 10)  Metformin Hcl 500 Mg Xr24h-Tab (Metformin Hcl) .... Take 1 Tablet By Mouth Two Times A Day 11)  Furosemide 40 Mg Tabs (Furosemide) .... 1/2-1 Tablet By Mouth Once Daily 12)  Hydrochlorothiazide 25 Mg Tabs (Hydrochlorothiazide) .... Take 1 Tablet By Mouth Once A Day 13)  Crestor 10 Mg Tabs (Rosuvastatin Calcium) .... Take 1 Tablet By Mouth Once A Day 14)  Prednisone 20 Mg Tabs (Prednisone) .... Taper As Directed  Allergies (verified): No Known Drug Allergies  Past History:  Social History: Last updated: 01/28/2011 Never married  Risk Factors: Smoking Status: never (01/28/2011)  Past Medical History: Bronchitis Dyspnea  hx DVT GERD  Left nephrectomy Anxiety Disorder  Past Surgical History: left nephrecomy  Social History: Never marriedSmoking Status:  never  Review of Systems      See HPI       The patient complains of shortness of breath with activity, productive cough, and nasal congestion/difficulty breathing through nose.  The patient denies shortness of breath at rest, non-productive cough, coughing up blood, chest pain, irregular heartbeats, acid heartburn, indigestion, loss of appetite, weight change, abdominal pain, difficulty swallowing, sore throat, tooth/dental problems, headaches, sneezing, itching, ear ache, hand/feet swelling, rash, change in color of mucus, and fever.    Vital Signs:  Patient profile:   70 year old female Height:      62 inches Weight:      199 pounds BMI:     36.53 O2 Sat:      99 % on Room air Pulse rate:   104 /  minute BP sitting:   14 / 70  (right arm) Cuff size:   large  Vitals Entered By: Gweneth Dimitri RN (January 28, 2011 3:00 PM)  O2 Flow:  Room air  Physical Exam  Additional Exam:  General: A/Ox3; pleasant and cooperative, NAD, overweight, anxiously hyperventilating, but easily distractable SKIN: no rash, lesions NODES: no lymphadenopathy HEENT: Kell/AT, EOM- WNL, Conjuctivae- clear, PERRLA,  TM-WNL, Nose- clear, Throat- clear and wnl NECK: Supple w/ fair ROM, JVD- none, normal carotid impulses w/o bruits Thyroid- normal to palpation CHEST: bilateral mild rhonchii and wheeze, fair airflow,  HEART: RRR, no m/g/r heard ABDOMEN:nontender WUJ:WJXB, nl pulses, no edema, neg Homan's NEURO: Grossly intact to observation      Impression & Recommendations:  Problem # 1:  ASTHMA, UNSPECIFIED, UNSPECIFIED STATUS (ICD-493.90) Exacerbation with subacute bronchitis and hyperventilation. Will give xopenex low dose neb to avoid increasing her anxiety VTE/PE was addresed with CT at [facility], but we will reassess if needed.  She was told magnesium level was low. We will give magox for tocolytic effect, knowing she was given IV Mg++ at ER before.  Now on prednisone taper. discussed prednisone.   Problem # 2:  ANXIETY STATE, UNSPECIFIED (ICD-300.00)  Reassurance, counseling on hyperventilation. She can use her lorazepam.  Her updated medication list for this problem includes:    Lorazepam 1 Mg Tabs (Lorazepam) .Marland Kitchen... 1 or 2 tablets by mouth three times a day as needed  Problem # 3:  DEEP VENOUS THROMBOPHLEBITIS, HX OF (ICD-V12.52) She had CT at [facility], with no PE found. I doubt recurrent DVT, with negative Homan's today.  Medications Added to Medication List This Visit: 1)  Advair Diskus 100-50 Mcg/dose Aepb (Fluticasone-salmeterol) .Marland Kitchen.. 1 puff two times a day 2)  Albuterol Sulfate (2.5 Mg/75ml) 0.083% Nebu (Albuterol sulfate) .... Every 4 hours 3)  Vicodin 5-500 Mg Tabs (Hydrocodone-acetaminophen) .... Take 1 tablet by mouth four times a day as needed pain 4)  Proair Hfa 108 (90 Base) Mcg/act Aers (Albuterol sulfate) .... As needed 5)  Spiriva Handihaler 18 Mcg Caps (Tiotropium bromide monohydrate) .... Once daily 6)  Lorazepam 0.5 Mg Tabs (Lorazepam) .... Take 1 tablet by mouth three times a day as needed 7)  Lorazepam 1 Mg Tabs (Lorazepam) .Marland Kitchen.. 1 or 2  tablets by mouth three times a day as needed 8)  Singulair 10 Mg Tabs (Montelukast sodium) .... Take 1 tab by mouth at bedtime 9)  Omeprazole 20 Mg Cpdr (Omeprazole) .... Take 1 capsule by mouth once a day 10)  Klor-con M20 20 Meq Cr-tabs (Potassium chloride crys cr) .... Take 1 tablet by mouth once a day 11)  Metformin Hcl 500 Mg Xr24h-tab (Metformin hcl) .... Take 1 tablet by mouth two times a day 12)  Furosemide 40 Mg Tabs (Furosemide) .... 1/2-1 tablet by mouth once daily 13)  Hydrochlorothiazide 25 Mg Tabs (Hydrochlorothiazide) .... Take 1 tablet by mouth once a day 14)  Crestor 10 Mg Tabs (Rosuvastatin calcium) .... Take 1 tablet by mouth once a day 15)  Prednisone 20 Mg Tabs (Prednisone) .... Taper as directed 16)  Magnesium Oxide 250 Mg Tabs (Magnesium oxide) .Marland Kitchen.. 1 two times a day x 1 week  Other Orders: Est. Patient Level IV (14782) Nebulizer Tx (95621)  Patient Instructions: 1)  Please schedule a follow-up appointment in 1 month. 2)  script for magnesium oxide 3)  neb xop 0.63 4)  We will try to contact [facility] for results of yur CT  scan there.  5)  Finish prednisone taper 6)  For anxiety, you can take your lorazepam 1 mg tabs-- 1 or 2,  7)   three times a day if needed.  Prescriptions: LORAZEPAM 1 MG TABS (LORAZEPAM) 1 or 2 tablets by mouth three times a day as needed  #180 x 0   Entered by:   Gweneth Dimitri RN   Authorized by:   Waymon Budge MD   Signed by:   Gweneth Dimitri RN on 01/28/2011   Method used:   Print then Give to Patient   RxID:   [id number]   Handout requested. MAGNESIUM OXIDE 250 MG TABS (MAGNESIUM OXIDE) 1 two times a day x 1 week  #14 x 0   Entered and Authorized by:   Waymon Budge MD   Signed by:   Waymon Budge MD on 01/28/2011   Method used:   Print then Give to Patient   RxID:   [id number]    Medication Administration  Medication # 1:    Medication: Xopenex 0.63mg     Diagnosis: ASTHMA, UNSPECIFIED,  UNSPECIFIED STATUS (ICD-493.90)    Dose: 1 vial    Route: inhaled    Exp Date: 04/2011    Lot #: [id number]    Mfr: Sepracor    Patient tolerated medication without complications    Given by: Gweneth Dimitri RN (January 28, 2011 4:42 PM)  Orders Added: 1)  Est. Patient Level IV [52841] 2)  Nebulizer Tx [phone]

## 2011-02-04 NOTE — Progress Notes (Signed)
Summary: req breathing tx today or rx for her nebulizer--rx sent  Phone Note Call from Patient   Caller: Patient's sister Cleda Mccreedy Call For: young Summary of Call: sister brought pt in to see dr young yesterday. she says that they had discussed him giving her a rx for meds to go in her neb but didn't get a rx. pt wants to either have this called in asap today or wants to come in for a breathing tx. call lula at [phone] - pt uses [facility] [address] Initial call taken by: Tivis Ringer, CNA,  January 30, 2011 3:31 PM  Follow-up for Phone Call        called and spoke with Cleda Mccreedy and she states that when Mrs. Hally came in for her apt on tuesday, Dr. Maple Hudson had mentioned give pt an rx for another nebulizer medication because the albuterol makes pt nervous. Cleda Mccreedy wants an rx for pt other than albuterol. Pt uses [facility] [address] Dr. Maple Hudson please advise. Thanks  KNDA  Carver Fila  January 30, 2011 4:00 PM   Additional Follow-up for Phone Call Additional follow up Details #1::        Per CDY-okay to give Xopenex 0.63 #325 1 four times a day as needed with as needed refills.Reynaldo Minium CMA  January 30, 2011 4:27 PM   called and informed lula rx was sent\par Carver Fila  January 30, 2011 4:33 PM     New/Updated Medications: XOPENEX 0.63 MG/3ML NEBU (LEVALBUTEROL HCL) 1 vial four times a day as needed Prescriptions: XOPENEX 0.63 MG/3ML NEBU (LEVALBUTEROL HCL) 1 vial four times a day as needed  #325 x PRN   Entered by:   Carver Fila   Authorized by:   Waymon Budge MD   Signed by:   Carver Fila on 01/30/2011   Method used:   Electronically to        [facility] [address] [id number] (retail)       [address] [place], [place]  [address]       Ph: [phone]       Fax: [phone]   RxID:   [id number]

## 2011-02-11 ENCOUNTER — Encounter: Payer: Self-pay | Admitting: Internal Medicine

## 2011-02-18 ENCOUNTER — Telehealth: Payer: Self-pay | Admitting: *Deleted

## 2011-02-18 NOTE — Telephone Encounter (Signed)
Pharmacist instructed to file Xopenex Neb Sol with MCR Part B. Pharmacist did so and there was no problem with the claim. He will notify the patient.

## 2011-02-24 ENCOUNTER — Telehealth: Payer: Self-pay | Admitting: Internal Medicine

## 2011-02-24 NOTE — Telephone Encounter (Signed)
SISTER LULA WANTS RETURN CALL TO [phone].  WANTS PATIENT TO BE SEEN THIS AFTERNOON.

## 2011-02-24 NOTE — Telephone Encounter (Signed)
Pt's sister, Cleda Mccreedy, aware we will call once Florentina Addison give [place] a date and time for HFU.

## 2011-02-24 NOTE — Telephone Encounter (Signed)
Spoke w/ Renee [name] and pt is coming in tomorrow at 3:30

## 2011-02-24 NOTE — Telephone Encounter (Signed)
SISTER LULA DEESE IS CONFUSED AND NEEDS TO KNOW WHEN SISTER CAN BE SEEN.  HER PHONE IS [phone].

## 2011-02-24 NOTE — Telephone Encounter (Signed)
Pt needs a hfu apt. Next available 30 min slot is 5/21 at 4:15. Please advise Florentina Addison if pt can be worked into a 15 minute slot. Thanks  Carver Fila, CMA

## 2011-02-25 ENCOUNTER — Encounter: Payer: Self-pay | Admitting: Internal Medicine

## 2011-02-25 ENCOUNTER — Ambulatory Visit (INDEPENDENT_AMBULATORY_CARE_PROVIDER_SITE_OTHER): Payer: Medicare Other | Admitting: Internal Medicine

## 2011-02-25 VITALS — BP 124/70 | HR 106 | Ht 62.0 in | Wt 196.6 lb

## 2011-02-25 DIAGNOSIS — F411 Generalized anxiety disorder: Secondary | ICD-10-CM

## 2011-02-25 DIAGNOSIS — J209 Acute bronchitis, unspecified: Secondary | ICD-10-CM

## 2011-02-25 DIAGNOSIS — R0602 Shortness of breath: Secondary | ICD-10-CM

## 2011-02-25 MED ORDER — CLORAZEPATE DIPOTASSIUM 3.75 MG PO TABS: 3.7500 mg | ORAL_TABLET | Freq: Two times a day (BID) | ORAL | Status: AC | PRN

## 2011-02-25 NOTE — Progress Notes (Signed)
  Subjective:    Patient ID: Renee [name], female    DOB: [birth year], 70 y.o.   MRN: [id number]  HPI 3 yoF with asthma, hx of DVT and significant anxiety. Sister is here. I was called by a Hospitalist in [place] recently when she was admitted. He felt the problem was mainly anxiety, but treated her for possible asthma. CT was reported negative for PE and tests were negative for any acute heart injury.  She still feels easily short of breath with exertion, comfortable lying down. PFT and 6 MWT were normal at last visit here September 26, 2010.  Paxil CR was discontinued 2 months ago because of insurance.  Gets sharp cramp pains around back. Uses paper bag to breathe in.    Review of Systems See HPI Constitutional:   No weight loss, night sweats,  Fevers, chills, fatigue, lassitude. HEENT:   No headaches,  Difficulty swallowing,  Tooth/dental problems,  Sore throat,                No sneezing, itching, ear ache, nasal congestion, post nasal drip,   CV:  No chest pain,  Orthopnea, PND, swelling in lower extremities, anasarca, dizziness, palpitations  GI  No heartburn, indigestion, abdominal pain, nausea, vomiting, diarrhea, change in bowel habits, loss of appetite  Resp: No excess mucus, no productive cough,  No non-productive cough,  No coughing up of blood.  No change in color of mucus.  No wheezing.  No chest wall deformity  Skin: no rash or lesions.  GU: no dysuria, change in color of urine, no urgency or frequency.  No flank pain.  MS:  No joint pain or swelling.  No decreased range of motion.  No back pain.  Psych:  No change in mood or affect. No depression or anxiety.  No memory loss.      Objective:   Physical Exam General- Alert, Oriented, Affect-appropriate, Hyperventilating, talkative  Skin- rash-none, lesions- none, excoriation- none  Lymphadenopathy- none  Head- atraumatic  Eyes- Gross vision intact, PERRLA, conjunctivae clear, secretions  Ears- Normal   Hearing, canals, Tm L ,R ,  Nose- Clear,  No- Septal dev, mucus, polyps, erosion, perforation   Throat- Mallampati II , mucosa clear , drainage- none, tonsils- atrophic  Neck- flexible , trachea midline, no stridor , thyroid nl, carotid no bruit  Chest - symmetrical excursion , unlabored     Heart/CV- RRR , no murmur , no gallop  , no rub, nl s1 s2                     - JVD- none , edema- none, stasis changes- none, varices- none     Lung- clear to P&A, wheeze- none, cough- none , dullness-none, rub- none. Grunting expiratory noise, but not stridor.      Chest wall- Abd- tender-no, distended-no, bowel sounds-present, HSM- no  Br/ Gen/ Rectal- Not done, not indicated  Extrem- cyanosis- none, clubbing, none, atrophy- none, strength- nl  Neuro- grossly intact to observation        Assessment & Plan:

## 2011-02-25 NOTE — Assessment & Plan Note (Signed)
I don't thoink there is much active asthma now. She can use her meds as needed.

## 2011-02-25 NOTE — Assessment & Plan Note (Addendum)
She seems anxious to the point of hyperventilating. Respecting the recent work up which reportedly was negative for PE, CHF, or acute lung process- I will offer some tranxene

## 2011-02-25 NOTE — Patient Instructions (Signed)
Try tranxene/ chlorazepate to help get your nerves under control  Don't combine it with other anxiety medications.   Try a heating pad on your sore back   Don't need to change your regular asthma meds

## 2011-02-28 ENCOUNTER — Encounter: Payer: Self-pay | Admitting: Internal Medicine

## 2011-02-28 NOTE — Assessment & Plan Note (Signed)
As discussed above, the problem now seems to be anxiety.

## 2011-03-07 ENCOUNTER — Other Ambulatory Visit: Payer: Self-pay | Admitting: *Deleted

## 2011-03-07 DIAGNOSIS — J45909 Unspecified asthma, uncomplicated: Secondary | ICD-10-CM

## 2011-03-07 MED ORDER — LEVALBUTEROL HCL 0.63 MG/3ML IN NEBU: 1.0000 | INHALATION_SOLUTION | Freq: Four times a day (QID) | RESPIRATORY_TRACT | Status: DC | PRN

## 2011-03-17 ENCOUNTER — Encounter: Payer: Self-pay | Admitting: Internal Medicine

## 2011-03-18 ENCOUNTER — Ambulatory Visit (INDEPENDENT_AMBULATORY_CARE_PROVIDER_SITE_OTHER): Payer: Medicare Other | Admitting: Internal Medicine

## 2011-03-18 ENCOUNTER — Encounter: Payer: Self-pay | Admitting: Internal Medicine

## 2011-03-18 VITALS — BP 134/80 | HR 86 | Ht 62.0 in | Wt 198.4 lb

## 2011-03-18 DIAGNOSIS — J45909 Unspecified asthma, uncomplicated: Secondary | ICD-10-CM

## 2011-03-18 DIAGNOSIS — R0602 Shortness of breath: Secondary | ICD-10-CM

## 2011-03-18 DIAGNOSIS — F411 Generalized anxiety disorder: Secondary | ICD-10-CM

## 2011-03-18 DIAGNOSIS — Z8672 Personal history of thrombophlebitis: Secondary | ICD-10-CM

## 2011-03-18 DIAGNOSIS — J209 Acute bronchitis, unspecified: Secondary | ICD-10-CM

## 2011-03-18 NOTE — Assessment & Plan Note (Signed)
Good control currently. I would like her to get out more as weather allows and we will see how this affects her. We have discussed her meds and use of rescue inhalers.

## 2011-03-18 NOTE — Assessment & Plan Note (Addendum)
Much calmer today but I think her family also belies she mostly gets anxious and hyperventilates. We have reviewed management.

## 2011-03-18 NOTE — Progress Notes (Signed)
  Subjective:    Patient ID: Renee [name], female    DOB: [birth year], 70 y.o.   MRN: [id number]  Shortness of Breath  Gastrophageal Reflux   21 yoF with asthma, hx of DVT and significant anxiety. Last seen 02/26/11-Sister here. I was called by a Hospitalist in [place] recently when she was admitted. He felt the problem was mainly anxiety, but treated her for possible asthma. CT was reported negative for PE and tests were negative for any acute heart . Today- Sister here again. "The shortness left a week ago" and she hasn't needed her breathing meds at all since then. She doesn't know what made the difference. Still notes easy DOE making beds and walking to mailbox.    Review of Systems  Respiratory: Positive for shortness of breath.    See HPI Constitutional:   No weight loss, night sweats,  Fevers, chills, fatigue, lassitude. HEENT:   No headaches,  Difficulty swallowing,  Tooth/dental problems,  Sore throat,                No sneezing, itching, ear ache, nasal congestion, post nasal drip,   CV:  No chest pain,  Orthopnea, PND, swelling in lower extremities, anasarca, dizziness, palpitations  GI  No heartburn, indigestion, abdominal pain, nausea, vomiting, diarrhea, change in bowel habits, loss of appetite  Resp: No excess mucus, no productive cough,  No non-productive cough,  No coughing up of blood.  No change in color of mucus.  No wheezing.    Skin: no rash or lesions.  GU: no dysuria, change in color of urine, no urgency or frequency.  No flank pain.  MS:  No joint pain or swelling.  No decreased range of motion.  No back pain.  Psych:  No change in mood or affect. No depression or anxiety.  No memory loss.      Objective:   Physical Exam  General- Alert, Oriented, Affect-appropriate, Hyperventilating, talkative   Obese, talkative today  Skin- rash-none, lesions- none, excoriation- none  Lymphadenopathy- none  Head- atraumatic  Eyes- Gross vision intact, PERRLA,  conjunctivae clear secretions  Ears- Normal-  Hearing, canals, Tm   Nose- Clear,  No- Septal dev, mucus, polyps, erosion, perforation   Throat- Mallampati II , mucosa clear , drainage- none, tonsils- atrophic  Neck- flexible , trachea midline, no stridor , thyroid nl, carotid no bruit  Chest - symmetrical excursion , unlabored     Heart/CV- RRR , no murmur , no gallop  , no rub, nl s1 s2                     - JVD- none , edema- none, stasis changes- none, varices- none     Lung- clear to P&A, wheeze- none, cough- none , dullness-none, rub- none.     Chest wall- Abd- tender-no, distended-no, bowel sounds-present, HSM- no  Br/ Gen/ Rectal- Not done, not indicated  Extrem- cyanosis- none, clubbing, none, atrophy- none, strength- nl  Neuro- grossly intact to observation        Assessment & Plan:

## 2011-03-18 NOTE — Patient Instructions (Signed)
We don't need to make any changes. Please let me know if you need anything before your next visit.

## 2011-03-18 NOTE — Assessment & Plan Note (Signed)
There is a considerable component of obesity with hypoventilation and deconditioning. She is at baseline now.

## 2011-03-22 NOTE — Assessment & Plan Note (Signed)
History of DVT, but at latest hosp in [place] this was explored and negative.

## 2011-03-22 NOTE — Assessment & Plan Note (Signed)
Currently in remission 

## 2011-04-01 NOTE — Assessment & Plan Note (Signed)
Renee [name]                             PULMONARY OFFICE NOTE   NAME:Renee [name]                     MRN:          [id number]  DATE:06/28/2007                            DOB:          04-28-1941    PROBLEM:  1. Dyspnea.  2. Asthmatic bronchitis.  3. Chronic anxiety.  4. Multiple somatic complaints.  5. History of left nephrectomy.  6. History of Nissen.  7. History of deep vein thrombosis.   HISTORY:  She was seen in late March by the nurse practitioner for  worsening dyspnea with a component of anxiety, but then resolved. Since  then, she has been doing better. She comments that she has been able to  lose a little weight, has been able to pick beans and do some similar  work in the garden including riding her riding mower, all of which she  says she has not felt up to doing it in the past year or two. She does  try to avoid the mid day heat, breathing has felt stable. Singulair  probably helps.   MEDICATIONS:  Reviewed as charted noting she continues Singulair, Advair  100/50 and albuterol nebulizer p.r.n., Spiriva once daily and she has a  rescue albuterol inhaler. Occasional use of Alavert.   DRUG INTOLERANCES:  No medication allergy.   OBJECTIVE:  Weight 198 pounds, blood pressure 122/70, pulse 93. Room air  saturation 97%. She looks comfortable.  CHEST: Clear.  Heart sounds regular without murmur.  She shows me fire ant bites on her ankles without any evidence of  cellulitis.   IMPRESSION:  Chronic asthma/bronchitis with multifactorial dyspnea  including components of deconditioning, obesity and anxiety.   PLAN:  Continue weight loss. We refilled her Singulair. I discussed  avoidance of stinging insects. Schedule return in four months, earlier  p.r.n.     Clinton D. Maple Hudson, MD, Tonny Bollman, FACP  Electronically Signed   CDY/MedQ  DD: 06/28/2007  DT: 06/29/2007  Job #: [id number]   cc:   Juline Patch, M.D.

## 2011-04-04 NOTE — Assessment & Plan Note (Signed)
[facility]                             PULMONARY OFFICE NOTE   NAME:Renee [name], Renee [name]                     MRN:          [id number]  DATE:01/28/2007                            DOB:          01/28/1941    PROBLEMS:  1. Dyspnea.  2. Asthmatic bronchitis.  3. Chronic anxiety.  4. Multiple somatic complaints.  5. History of left nephrectomy.  6. History of Nissen.  7. History of deep vein thrombosis.   HISTORY:  She was being evaluated at her primary office today and got  short of breath on the exam table.  They did a chest x-ray and sent it  over.  It looks very similar to our film of January 8, essentially clear  except for some elevation of the left hemidiaphragm.  Left upper  quadrant clips are noted.  Our film showed no active cardiopulmonary  disease from January.  She blames her diabetes medication for occasional  shortness of breath, a variety of different aches and pains.  Her  albuterol inhaler does help her breathing when used very occasionally.  She was given a prescription for Flexeril at her primary office and is  going to try that.   MEDICATIONS:  1. Singulair 10 mg.  2. Protonix 40 mg.  3. Paxil CR 37.5 mg.  4. Actonel 35 mg.  5. Advair 100/50.  6. Albuterol by nebulizer.  7. HCTZ.  8. Potassium 20 mEq.  9. Crestor 5 mg.  10.Flexeril 10 mg.  11.P.r.n. use of hydrocodone.  12.Albuterol inhaler.  13.Lasix.  14.Alavert.   NO MEDICATION ALLERGY.   OBJECTIVE:  Sister is with [place] today.  Weight 209 pounds, blood pressure  128/82, pulse 103, room air saturation 100%.  CHEST:  Quiet and clear.  HEART:  Sounds regular without murmur.  Mildly anxious personality.  In no evident physical distress.   IMPRESSION:  Anxiety.  Episode of dyspnea, nonspecific, musculoskeletal  pains.   PLAN:  Weight loss, reassurance, try the Flexeril.  I told her we could  evaluate her further if needed.  Her sister seemed in strong agreement  with  a conservative wait and see approach together with reassurance.  She has an appointment later this summer and will keep that unless she  needs to be seen sooner.     Clinton D. Maple Hudson, MD, Tonny Bollman, FACP  Electronically Signed    CDY/MedQ  DD: 01/28/2007  DT: 01/30/2007  Job #: [id number]   cc:   Juline Patch, M.D.

## 2011-04-04 NOTE — Assessment & Plan Note (Signed)
[facility]                             PULMONARY OFFICE NOTE   NAME:Renee [name], Renee [name]                     MRN:          [id number]  DATE:12/07/2006                            DOB:          05-11-1941    PROBLEM LIST:  1. Dyspnea.  2. Asthmatic bronchitis.  3. Chronic anxiety.  4. Multiple somatic complaints.  5. History of left nephrectomy.  6. History of Nissen.  7. History of deep vein thrombosis.   HISTORY:  She has switched primary care to Dr.  Juleen China and says she is  now borderline diabetic, but from a breathing standpoint this has been  the best year in some time. She prefers the cool weather. Usually  occasional diuretic p.r.n. for ankle edema. No acute events, cough, or  chest pain. She does not get flu vaccine. She had had a pneumonia  vaccine years ago, but has not had a booster and chooses to have one  today.   MEDICATIONS:  1. Singulair 10 mg.  2. Protonix 40 mg.  3. Paxil CR 37.5 mg.  4. Actonel 35 mg.  5. Advair 100/50.  6. Albuterol nebulizer q.i.d., used very occasionally.  7. HCTZ.  8. Potassium 20 mEq.  9. Crestor 5 mg p.r.n.  10.Use of albuterol inhaler.  11.Lasix 20 mg.  12.Alavert.   NO MEDICATION ALLERGY.   OBJECTIVE:  Weight 218 pounds, which is stable compared with July. Blood  pressure 132/72, pulse regular 89, room air saturation 98%.  Breath  sounds are quiet with clear chest, unlabored. There is not stridor, no  cough. Heart sounds are regular without murmur or gallop. Nasal airway  is clear. There is no ankle edema. Soft varices are evident in the  calves.   IMPRESSION:  1. Chronic asthma with bronchitis, clinically stable and well      controlled.  2. Peripheral edema. The edema  reflects some venous insufficiency,      controlled.   PLAN:  1. Pneumococcal vaccine booster was given today after discussion.  2. Chest x-ray.  3. Schedule return 6 months, earlier p.r.n.     Clinton D. Maple Hudson, MD,  Tonny Bollman, FACP  Electronically Signed    CDY/MedQ  DD: 12/07/2006  DT: 12/07/2006  Job #: [id number]   cc:   Brooke Bonito, M.D.

## 2011-04-04 NOTE — Discharge Summary (Signed)
NAME:  Renee [name], Renee [name]                        ACCOUNT NO.:  [id number]   MEDICAL RECORD NO.:  [id number]                   PATIENT TYPE:  INP   LOCATION:  5738                                 FACILITY:  [facility]   PHYSICIAN:  Hettie Holstein, D.O.                 DATE OF BIRTH:  1941/01/26   DATE OF ADMISSION:  12/05/2003  DATE OF DISCHARGE:  12/09/2003                                 DISCHARGE SUMMARY   PRIMARY CARE PHYSICIAN:  Juline Patch, M.D.   CONSULTS:  1. Pulmonary, Clinton D. Maple Hudson, M.D.  2. Cardiology, Georga Hacking, M.D.   CHIEF COMPLAINT:  Episodic shortness of breath.   ADMISSION DIAGNOSIS:  Dyspnea, unexplained.   DISCHARGE DIAGNOSES:  1. Paroxysmal dyspnea after thorough evaluation including negative BNP,     unremarkable echocardiogram  and cardiology evaluation  per Dr. Donnie Aho as     well as pulmonary evaluation  by Dr. Jetty Duhamel and the patient's     recurrent complaints of dyspnea without obvious etiology per thorough     evaluation. It is felt that outpatient prescription for Xanax would be     helpful.  2. Asthmatic bronchitis.  3. Hypertension.  4. 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  5. Chronic lower extremity thrombophlebitis, negative thorough evaluation     for venous thromboembolism this admission  and last admission which was     negative.  6. Status post  Nissen fundoplication in 1997 and repair in 1998.  7. History of a loculated, exudative pleural effusion, status post CT     drainage in 1998 following  her Nissen fundoplication.  8. Status post bilateral total knee replacements.  9. Status post cholecystectomy.  10.      Status post  hyperlipidemia.  11.      History of depression and anxiety.   HISTORY OF PRESENT ILLNESS:  This is a  70 year old female  who was  discharged on December 04, 2003, by Dr. Sherrie Mustache with a hospital course for  fully resolving asthmatic bronchitis since November 23, 2003. She  presented  with orthopnea and without a prior history of CAD. She had inconclusive  echos this past admission and she reported that she had a normal sleep study  by her. A CT scan of her chest was normal and her BNPs have remained less  than 30 consistently. She has remained with clear chest x-rays, no evidence  of pneumonia. Despite the above, the patient has continued to complain of  paroxysmal episodes of dyspnea and insisted on meeting with Dr. Jetty Duhamel and she was admitted.   HOSPITAL COURSE:  The patient was admitted to the regular nursing floor. She  was continued on her medications as well as steroids. Dr. Maple Hudson was asked to  evaluate her. He recommended evaluation  by Dr. Donnie Aho. Dr. Donnie Aho reviewed  the echocardiogram. There was no suggestion  of congestive heart failure or  any cardiac etiology of this patient's complaints. She underwent PFTs that  were reported as normal by Dr. Jetty Duhamel, and it was suggested that  possibly her ACE inhibitor may be contributing and this was held. She was  also recommended for cardiopulmonary rehabilitation as an outpatient.   These are the recommendations for her outpatient physician, Dr. Ricki Miller. Also  under disposition I will include Dr. Alona Bene phone number for follow  up in 1 to 2 weeks as per his suggestion, [phone], and follow up with Dr.  Ricki Miller as needed.   DISCHARGE MEDICATIONS:  1. I am writing her a prescription for Xanax 0.25 mg p.o. t.i.d.  2. She has a prescription for a quick prednisone taper as recommended by Dr.     Jetty Duhamel, 20 mg x2 days, 10 mg x2 days, 5 mg x 2 days  then     discontinue.  3. Lasix and Calor only on a p.r.n. basis 20 mg p.o. q.o.d. as needed.  4. Calor 20 mEq p.o. q.o.d. only when taking Lasix.  5. She is to hold her  Accupril.  6. We are going to initiate her on HCTZ for blood pressure control which may     assist in some of the edema.  7. Protonix 40 mg p.o. every day which I will  write a prescription for her     as well for 2 weeks.  8. She is also instructed to continue Paxil and Advair as before.                                                Hettie Holstein, D.O.    ESS/MEDQ  D:  12/09/2003  T:  12/09/2003  Job:  [id number]   cc:   Juline Patch, M.D.  [address]  [place], [place] [address]  Fax: [phone]   W. Ashley Royalty., M.D.  [address]  [place]  [place] [address]  Fax: [phone]   Clinton D. Young, M.D.  [address] [place]  [place] [address]  Fax: [phone]

## 2011-04-04 NOTE — Discharge Summary (Signed)
NAME:  Renee [name], Renee [name]                        ACCOUNT NO.:  [id number]   MEDICAL RECORD NO.:  [id number]                   PATIENT TYPE:  INP   LOCATION:  6734                                 FACILITY:  [facility]   PHYSICIAN:  Hettie Holstein, D.O.                 DATE OF BIRTH:  03/09/1941   DATE OF ADMISSION:  11/23/2003  DATE OF DISCHARGE:                                 DISCHARGE SUMMARY   ADMISSION DIAGNOSES:  1. Cough  and pleuritic chest pain.  2. Chronic right lower extremity thrombophlebitis.   DISCHARGE DIAGNOSES:  1. Asthmatic bronchitis, slow to improve.  2. Chronic lower extremity thrombophlebitis, status post lower extremity     Dopplers. This admission without evidence of deep venous thrombosis.  3. Hypertension.  4. Status post left nephrectomy secondary to renal cell cancer, done by Dr.     Earlene Plater in 1998.  5. Status post  Nissen fundoplication in 1997, repair in 1998.  6. History of loculated exudative pleural effusion status post CT drainage     in 1998, following  her Nissen fundoplication.  7. Status post  bilateral total knee replacement.  8. Status post cholecystectomy.  9. Status post  hyperlipidemia.  10.      History of depression and anxiety.   STUDIES:  1. A CT scan of the chest to rule out pulmonary embolism. Findings were as     follows. The CT scan of the chest with intravenous contrast revealed no     acute disease, only moderate hiatal hernia, no pulmonary embolus or other     significant abnormality.  2. A 2D echocardiogram was performed and reported as suboptimal. The results     were inconclusive.  3. A DNP performed was negative for congestive heart failure.  4. Lower extremity Dopplers as stated above were negative for deep venous     thrombosis. Peak flow meters progressively improved  from 200 to 350     throughout the hospitalization.   DISPOSITION:  The patient has an anticipated date of discharge on December 02, 2002, when she  transitions to p.o. steroids unless she clinically  decompensates post dictation.   LABORATORY DATA:  Negative CK-MB and troponins. Hemoccult stools were  negative. Sputum cultures pending. A BNP  of  less than 30. Admission ABG of  pH of 7.45, pCO2 33, pO2 70, bicarbonate of 22. Oxygen saturation was 95%.   DISCHARGE MEDICATIONS:  1. Prednisone taper over 2 weeks.  2. Avelox 400 mg p.o. every day to continue for 5 more days.  3. Accupril 40 mg p.o. every day.  4. Paxil 20 mg p.o. q. h.s.  5. Advair 500/50 q.12h.  6. Stop Coumadin.  7. Nebulizer treatments at home with albuterol and Atrovent. Recommend     pulmonary rehabilitation as an outpatient if this is available.   HISTORY OF PRESENT ILLNESS:  For complete details see the  history and  physical. This is a 70 year old female  with a past medical history  significant for asthmatic bronchitic, left renal cell cancer, status post  partial nephrectomy in 1998, chronic problems with phlebitis of the right  leg, on chronic  Coumadin therapy. She presented to Dr. Lynne [name] office as a  follow up from the emergency department day previously. The patient had  presented to the emergency room with a 1 week history of shortness of breath  on exertion and at rest, orthopnea, pleuritic chest pain bilaterally and  wheezing. The patient also had a productive cough with yellow sputum.   She has had subjective fever but no subjective chills with pleuritic chest  pain on the right and left with associated diaphoresis, radiation down the  left arm, light headedness or syncopal episodes. She has had associated  nausea after coughing. She denies any vomiting, middle abdominal pain,  constipation, diarrhea, dysuria or rashes proximally  in her legs or feet,  however, she has not had any significant increase in swelling.   When the patient was seen in the emergency department, her chest x-ray  revealed no acute disease. The patient was given a breathing  treatment and  sent home. __________. She presented to her PCP's office without improvement  and was sent back for further evaluation  and management.   HOSPITAL COURSE:  The patient was evaluated once again and revealed normal  BNP less than 30. A CT scan of the chest revealed no evidence of pulmonary  emboli. Her course was of slow improvement with regards to the bronchospasm  on IV steroids and antibiotics. She did improve gradually, however. Also  lower extremity Dopplers were performed without evidence of deep venous  thrombosis. She had a super therapeutic INR that was corrected and Coumadin  was discontinued.  She continued to complain of cough  and shortness of  breath, however, her clinical improvement was notable.   Today it is expected that she will be able to be discharged in the morning  once we step  down to p.o. steroids. We will continue with a dose taper and  have her follow up with her primary care physician within 2 weeks. For any  changes please see the note addendum from the discharging physician.                                                Hettie Holstein, D.O.    ESS/MEDQ  D:  12/02/2003  T:  12/02/2003  Job:  [id number]   cc:   Juline Patch, M.D.  [address]  [place], [place] [address]  Fax: [phone]

## 2011-04-04 NOTE — Consult Note (Signed)
NAME:  Renee [name], Renee [name]                        ACCOUNT NO.:  [id number]   MEDICAL RECORD NO.:  [id number]                   PATIENT TYPE:  INP   LOCATION:  5738                                 FACILITY:  [facility]   PHYSICIAN:  W. Ashley Royalty., M.D.         DATE OF BIRTH:  04-22-1941   DATE OF CONSULTATION:  12/07/2003  DATE OF DISCHARGE:                                   CONSULTATION   Thank you for asking me to see this 70 year old female for evaluation of  dyspnea.  The patient has a very complex past history.  She was hospitalized  in November of 2001 at which time she had a prior history of a loculated  exudative pleural effusion following Nissen fundoplication presumed to be  parapneumonic.  She has had suspected sleep apnea and has also had a  previous left nephrectomy due to renal ell carcinoma and has had significant  shortness of breath and asthma.  At that time, she had a negative CT scan.  She, at that time, had progressive shortness of breath and was admitted  during that admission.  She was treated with prednisone taper as well as  Advair inhaler.  She later was admitted and thought to have extreme  deconditioning resulting in dyspnea on exertion.  She had hypertension as  well as hyperlipidemia and was thought to have an anxiety disorder.  She, at  that time, had complained of severe shortness of breath for months and seen  a number of doctors.  She had had extreme tachypnea and was unable to walk  from her bed.  She saw Dr. Danice Goltz as an outpatient and was also  diagnosed with panic attacks.  She had similar presentation at that time.  During that admission, there was difficulty with the family and they  insisted on bringing her back to the hospital until something was found to  do for her.  She had a cardiac catheterization during that admission by Dr.  Nanetta Batty on October 20, 2003, that showed normal coronary arteries and  normal left ventricular  function.  There was a very difficulty history with  significant dyspnea as well as significant deconditioning and difficulty  with following recommendations.  She was seen by me in consultation and  after reviewing the catheterization I told her that I did not think that she  had blockage previously.  She presented to the emergency room with  increasing dyspnea, orthopnea, pleuritic chest pain, and wheezing.  She had  productive cough with yellow sputum.  She was just discharged on December 01, 2002, and had negative evidence for deep venous thrombosis.  She had CT scan  of the chest with hiatal hernia with no pulmonary embolus.  Echocardiogram  results were suboptimal.  BNP was negative for congestive heart failure and  her peak flow improved.  She was continued at home on prednisone taper as  well as nebulizer treatments.  She was readmitted on March 05, 2003, with  asthmatic bronchitis and increasing shortness of breath and paroxysms of  dyspneal. During this admission, her BNP level had been less than 30.  White  count was 20,600 and she was mildly anemic.  Serial enzymes were normal.  EKG showed voltage for LVH with left axis deviation.  Echocardiogram  done  on December 06, 2003, was reported to show normal left ventricular function  with an estimated EF of 55 to 65%.  The valves were not seen.  There was no  pericardial effusion.  The right ventricle was not well visualized at that  time.  Chest x-ray has been clear.  The patient has continued to complain of  dyspnea, has been seen by the pulmonary physician.  I was asked to see her  from a cardiologic viewpoint.   PAST MEDICAL HISTORY:  Her past history is complex.  She has a prior history  of hypertension, hyperlipidemia, and significant asthmatic bronchitis.  She  has a history of chronic phlebitis and has been on chronic Coumadin therapy.  She has a prior history of a hypernephroma.  She has a known hiatal hernia.   PAST  SURGICAL HISTORY:  Breast biopsy, bilateral foot surgery,  cholecystectomy, bilateral knee replacements, and previous left partial  nephrectomy due to renal cell carcinoma in 1998.   ALLERGIES:  No known drug allergies.   MEDICATIONS ON ADMISSION:  Accupril, Paxil, hydrocodone, Os-Cal, Advair, and  Coumadin.  She also had been on a prednisone taper.   SOCIAL HISTORY:  She lives in [facility] [place], but visits her  sister here in town when she gets sick.  She is single and has no children.  She is retired.  She has no alcohol or drug use.  She completed the seventh  grade in school.   FAMILY HISTORY:  Mother died of CHF at age 25, father died of an MI at age  41.  Brother is 75 and has a history of coronary artery bypass grafting and  myocardial infarction.  Sister is 57 with no apparent health problems.   REVIEW OF SYMPTOMS:  She has been obese for several years and has been  unable to lose any significant weight.  She does have chronic dyspnea and  describes PND and orthopnea, has described edema since she has been  discharged.  She has no visual complaints, no difficulty hearing, no chronic  headaches.  She has a known history of a hiatal hernia and has a previous  Nissen fundoplication of this.  She has some chronic dyspepsia.  She has  been diagnosed with some mild anemia.  She does have urinary frequency.  She  has no hematuria.  She has significant generalized arthritis and does have  swelling in her legs and her feet.  She also has had significant difficulty  with anxiety and depression in the past.   PHYSICAL EXAMINATION:  GENERAL APPEARANCE:  She is an elderly woman who,  when I entered the room, was eating lunch and was in no apparent respiratory  distress and had normal respirations.  When she saw me, the minute  ventilation automatically picked up and she became very tachypneic after observing me, but I had observed her for at least 30 seconds prior to  that  and noticed that she was breathing fine without tachypnea.  VITAL SIGNS:  Her blood pressure is 101/62, oxygen saturations 94%, weight  is 213 pounds.  SKIN:  Warm and dry.  HEENT:  EOMI, PERRLA.  CNS clear.  NECK:  Somewhat big.  Could not assess JVD accurately.  LUNGS:  Faint wheezing bilaterally.  She tends to hyperventilate and have  shallow volumes of respiration but can take deep breaths.  CARDIOVASCULAR:  Tachycardia but no S3 and no definite murmur.  ABDOMEN:  Obese, soft and nontender.  EXTREMITIES:  There is 1+ peripheral edema noted, 2+ pedal pulses noted.  LYMPH NODES:  Unremarkable.   EKG showed left axis deviation, voltage for LVH.   Report of chest x-ray done two days ago shows clear lung fields and normal  heart mediastinal structures.  A previous CT scan of the chest showed  moderate hiatal hernia with no pulmonary embolus.  It was done two weeks  ago.   IMPRESSION:  1. Dyspnea that I do not think has a cardiovascular basis for it.  She has     had a repeat echocardiogram  that showed normal left ventricular function     and she has a B natriuretic peptide level of less than 30.  She has a     normal cardiac catheterization three years ago and has had similar     presentations for dyspnea in the past.  I think it is unlikely that this     is cardiac dyspnea and suggest that this is either hyperventilation or     residual from the recent hospitalization for asthmatic bronchitis and     reactive airway disease.  2. Previous hypertension.  3. Hiatal hernia.  4. Anxiety and depression.  5. History of normal coronary arteries at catheterization in 2001.  6. Obesity.  7. History of thrombophlebitis.  8. History of hypernephroma.   RECOMMENDATIONS:  As mentioned above, I do not think that this is cardiac  dyspnea.  I think that she should lose weight.  I do not think that she  needs further cardiac work-up at this time.  She should lose weight and get  regular  exercise and have her lungs treated intensively.  She may have some  fluid retention related to the diuretics and I think could have some  diuresis for this.  Sometimes lisinopril can cause cough or feeling of  dyspnea and I suggest  that we discontinue this for the time being.   Thank you for asking me to see her with you.                                               Darden Palmer., M.D.    WST/MEDQ  D:  12/07/2003  T:  12/07/2003  Job:  [id number]   cc:   Elliot Cousin, M.D.

## 2011-04-04 NOTE — Assessment & Plan Note (Signed)
[facility]                               PULMONARY OFFICE NOTE   NAME:Renee [name], Renee [name]                     MRN:          [id number]  DATE:06/12/2006                            DOB:          [birth year]    PROBLEM:  1.  Dyspnea.  2.  Asthmatic bronchitis.  3.  Chronic anxiety.  4.  Multiple somatic complaints.  5.  History of left nephrectomy.  6.  History of Nissen.  7.  History of deep venous thrombosis.   HISTORY:  She comes for scheduled followup saying that she is fine as long  as she stays indoors most of the time to avoid the heat.  Occasional dust  exposure makes her wheeze.  By this, she means road and outdoor dust  exposures more than in home.  I went to the ER of [facility]  July 18 for ankle edema and dyspnea, where they did a chest x-ray and EKG,  treated with Lasix 20 mg to add p.r.n.  She has not taken it this week.  No  chest pain.  Breathing does not wake her at night, minimal cough or phlegm.  She has been off of prednisone.   MEDICATIONS:  1.  Singulair 10 mg.  2.  Protonix 40 mg.  3.  Paxil CR 37.5.  4.  Actonel 35.  5.  Advair 100/50.  6.  Albuterol nebulizer.  7.  Hydrochlorothiazide.  8.  Kay Ciel 20.  9.  Crestor 5 mg.  10. Hydrocodone.  11. Albuterol metered inhaler.  12. Lasix 20 mg p.r.n.  13. Alavert.   ALLERGIES:  NO MEDICATION ALLERGY.   OBJECTIVE:  VITAL SIGNS:  Weight 218 pounds compared with 202 pounds  recorded here December 28th and 211 pounds recorded March 9th.  BP 146/74,  pulse regular 82, room air saturation 99%.  GENERAL:  She is obese.  LUNGS:  Lung fields are clear.  I do not hear rales or wheeze now.  HEART:  Sounds are regular.  I do not hear gallop or increased P2.  I do not  find peripheral edema or cyanosis.   IMPRESSION:  1.  Asthmatic bronchitis and probable component of peripheral edema, now      improved.  2.  Exogenous obesity.   PLAN:  Schedule return in six  months, earlier p.r.n.                                   Clinton D. Maple Hudson, MD, [facility], FACP   CDY/MedQ  DD:  06/15/2006  DT:  06/15/2006  Job #:  [id number]   cc:   Juline Patch, MD

## 2011-04-04 NOTE — Discharge Summary (Signed)
[facility]  Patient:    Renee [name], Renee [name]                         MRN: [id number] Adm. Date:  [id number] Disc. Date: [id number] Attending:  Noland Fordyce CC:         Danice Goltz, M.D.  Anastasio Auerbach, M.D.  Farley Ly, M.D.  Dr. Janice Norrie, [place], [place] (fax: [phone])  Sleep Study Lab, [facility], (fax: [phone])   Discharge Summary  DISCHARGE DIAGNOSES: 1. Extreme deconditioning resulting in dyspnea on exertion. 2. Hypertension. 3. Hyperlipidemia. 4. Obesity. 5. Anxiety disorder. 6. Anemia. 7. Hyperglycemia.  ADMISSION HISTORY:  Renee [name] is a 70 year old female who normally receives her health care in her hometown of [place], [place].  She has a sister who lives in [place].  Apparently when she feels ill, she visits her sister for a while, and she occasionally obtains her health care in [place].  The patient has had severe shortness of breath for many months. She has seen a number of doctors and was hospitalized at [facility] approximately one week before she required admission to [facility].  The patient describes a four-week history of extreme tachypnea with a respiratory rate in the 30s and 40s.  She has been unable to walk from her bed to the bathroom for 24 hours a day seven days a week for the past four weeks she says.  She says she also wakes up gasping for air in the middle of the night. She had seen Dr. Janice Norrie in [place], [place] and was treated with a 10-day antibiotic and prednisone taper course for presumed bronchitis.  She got no better, so all of this was renewed for another 10-day course.  She still got no better, so went to Urgent Care here in [place] and was given a number of inhalers to use.  Seven days later she went back to Urgent Care, and they sent her directly to [facility] where she was admitted by Dr. Anastasio Auerbach.  She had a 2-D echo performed at that time.  The  2-D echo was not available at the time of this new admission since it was done at another hospital.  The patient says she was told she had some problems on her 2-D echo.  She was discharged with hand-held nebulizer equipment and treatments ordered.  She was also scheduled to see Dr. Danice Goltz and her doctor back in [place], [place], and she also had a sleep apnea study scheduled. Less than 24 hours after her discharge from [facility] she presented to [facility] Emergency Room and was diagnosed with a panic attack, and she was given Xanax to take.  The patient says she is afraid of taking too much Xanax and has only been taking it when she feels she absolutely has to which is approximately twice a day.  She then saw Dr. Danice Goltz, a pulmonologist. Dr. Jayme Cloud told the patient, per the patients report of course, that she did not have any lung problem at all, but she definitely had a heart problem. She was stating that Dr. Jayme Cloud said that if she had any more trouble breathing, she had to go to the emergency room immediately.  She was given Advair and Protonix which she had not started by the time she presented to [facility] Emergency Department.  In the emergency department, it was determined by the emergency room physician that the patient required  admission because of her frequent appearance with the same complaint and with an undetermined cardiac status.  Since she did not have a readily obvious pulmonary problem, she was admitted primarily to rule out MI.  HOSPITAL COURSE:  Initially, a CT scan was ordered of her chest, but it was learned after it was ordered that she had one of these performed on November 21 at [facility].  The CT scan showed no evidence of pulmonary embolus, and the lungs appeared entirely clear. She also at that time had CT scan of the lower extremities, and this revealed no evidence of DVT from the hemidiaphragms to the knees.  The following day  Dr. Lilia Pro began to see the patient as cross-cover physician.  She consulted [facility]. Cardiac catheterization was set up, and this was performed at [facility] on the Monday following her admission.  Her cardiac cath was negative, and she was returned to [facility].  Dr. Johnsie Kindred was prepared to discharge the patient, but the patient and her sister essentially stated that they would simply bring her right back to the emergency room unless more was done because she was no better.  Dr. Johnsie Kindred decided that the patient was extremely deconditioned and had a skilled need for physical therapy, but this needs to be confirmed by a physical therapy consult.  Physical therapy consulted on the patient on December 4.  They felt that the patient had minimal to slight dyspnea on exertion after ambulating 400 feet.  Her resting pulse oximetry was 96%, her heart rate was 73 at the time.  After ambulating 400 feet her resting pulse oximetry was 98%, and her heart rate was at 100.  She was felt to be in need of home health physical therapy for aerobic reconditioning.  The patient was discharged after it was explained to her that she did not need a 2-D echo since she had just had a cardiac catheterization.  The sister had requested a 2-D echo and an MRI.  The patient was not felt to be in need of an MRI since she had had a CT scan of her chest and indeed a CT scan of her abdomen down to her knees only one week ago, and there was no indication of any abnormality that would necessitate an MRI being obtained.  It was explained that the patient was extremely deconditioned and obese.  She was advised to lose 30 pounds gradually and to do this under the supervision of her physician in [place], [place].  She was also set up with home health for aerobic  reconditioning.  This will be established in her sisters home temporarily, and then perhaps the same services can be  transferred when the patient moves once again back to [place], [place].  The patient did not need any prescriptions written since she was to restart her home medication.  She was also advised to contact Dr. Danice Goltz and establish another appointment there.  It was not known where pulmonary function tests were obtained, but it is doubtful these have been done.  It might be useful information, given that this is likely to be an ongoing problem with this patient.  DISCHARGE MEDICATIONS: 1. Zestril 20 mg p.o. q.d. 2. Paxil 20 mg p.o. q.d. 3. Protonix 40 mg p.o. q.d. 4. Vicodin one p.o. b.i.d. p.r.n. arthritic pain in the knees. 5. Lasix 20 mg p.o. q.d. 6. Xanax 0.25 mg to be taken t.i.d. 7. Os-Cal 500 with vitamin D one  p.o. t.i.d. 8. Aspirin was discontinued since it was felt it was not clearly indicated for    use in this patient. 9. Toprol had been prescribed to the patient in the hospital, but this was    continued since her cardiac cath was negative.  DISCHARGE FOLLOWUP:  Follow-up will be with Dr. Janice Norrie in [place], [place], Dr. Danice Goltz here in [place], and home health PT will need to be transferred from [facility] to [place], [place] when the patient moves back there.  ADDENDUM:  It was recognized that the patients anxiety disorder was a factor in her frequent presentation to health care facilities and physicians.  This was a difficult situation for the patient, the patients sister, and all caring for her in that she had presentation which was entirely consistent with cardiac disease, but indeed turned out not to have ischemic cardiac disease. The patients previous Discharge Summary was obtained and showed that it was recommended that she obtain pulmonary function tests from her primary physician and that she go through with a sleep study scheduled for Thursday at 7:45 p.m. at [facility] on December 20.  An additional note is  pertinent in that the patient has a history of Nissen fundoplication in 1998.  The patient reports that she had this surgery done three times.  She relates that she was told by the surgeon at that time that her esophagus was somewhat shorter than most peoples anatomy and that her stomach was high up in her chest.  This might cause some reactive airways disease which bears some consideration.  It should be noted that multiple records were requested, and most were received, but I still have not received a copy of the 2-D echo done at [facility]; however, I have been assured by Dr. Sheppard Penton that this report has been forwarded to her local physician in [place], [place]. DD:  10/22/00 TD:  10/22/00 Job: [id number]

## 2011-04-04 NOTE — Consult Note (Signed)
NAME:  Renee [name], Renee [name]                        ACCOUNT NO.:  [id number]   MEDICAL RECORD NO.:  [id number]                   PATIENT TYPE:  INP   LOCATION:  5738                                 FACILITY:  [facility]   PHYSICIAN:  Clinton D. Maple Hudson, M.D.              DATE OF BIRTH:  [birth year]   DATE OF CONSULTATION:  12/06/2003  DATE OF DISCHARGE:                                   CONSULTATION   PROBLEM FOR CONSULTATION:  A 70 year old white female nonsmoker complaining  of dyspnea.   HISTORY:  This woman was hospitalized January 6 through January 15 and just  readmitted complaining she remains short of breath.  I had seen her on an  outpatient basis for a similar complaint in 2002 at which time she weighed  213 pounds.  Workup at that time included pulmonary function tests showing  only slight restriction, an arterial blood gas on room at [facility] in May 2002  reporting a pH of 7.42, PCO2 35, PO2 90.  She had had a nocturnal  polysomnogram in December 2001 which was within normal limits.  She had had  a heart catheterization and an echocardiogram but those results were not  available to me.  She had been told then that she had a small MRI based on  EKG changes at some point in the past.  She was thought to have at least a  component of asthmatic bronchitis without other specific abnormality  identified.  We did question the role of prior hiatal hernia surgery  contributing to her dyspnea.  She is currently on prednisone, tapering, high  dose Advair Diskus, and nebulizer treatments with Albuterol and Atrovent as  well as low flow oxygen but she continues to complain of dyspnea.  She tells  me that as long as she takes Lasix she does not get this problem, and then  tells me that she is short of breath despite the Lasix she has been taking.  She believes her problem is fluid pointing to her abdomen saying that it  is bloated and swollen because of fluid.   REVIEW OF SYSTEMS:  Tussive right  posterolateral chest wall pain.  Scant  white sputum, no blood, no fever or chills.  Felt a little nauseated one day  but has had no difficulty swallowing, no persistent nausea or vomiting, no  change in bowel or bladder.  There has been no evidence of bleeding, no  adenopathy.   LABORATORY:  The natriuretic peptide has been normal, less than 30.  Arterial blood gas on admission on room air showed a pH of 7.61, PCO2 23,  PO2 91, bicarbonate 24, saturation of 99% suggesting hyperventilation.  She  has now had an echocardiogram with results pending.   FAMILY:  No children.  Father died of MI.  Mother died with CHF.  One  brother with coronary disease, one sister with allergies and asthma.   SOCIAL:  Never smoked.  Disabled.   PAST HISTORY:  Hiatal hernia repair including a Nissen fundoplication and  probably two separate attempts to repair by laparoscopy with concern that  the wrap was too tight in the past.  Has had heart catheterization and  previous echocardiogram.  Told she had GERD.  Told MI by EKG.  Nocturnal  polysomnogram with an RDI of two obstructive events per hours in December  2001.  Left chest tube 1999 for an exudative effusion.  She has had  pneumococcal vaccine.  Arthritis with bilateral total knee replacements.  Deep vein thrombosis without documented pulmonary embolism.  Breast lump  biopsy.  Partial left nephrectomy for cancer.  Anxiety and depression.   MEDICATIONS:  Vicodin, nebulizer with Albuterol and Atrovent, Protonix 40  mg, KCL, Paxil, Advair 500/50, Lisinopril 40 mg daily, Lasix 20 mg IV,  prednisone.   No drug allergy identified.   EXAM:  BP 94/54, heart rate 111, EKG normal, respirations 20, temperature  98, saturation 94% recorded as being on 1 L although her oxygen now is set  at 2 L.  GENERAL APPEARANCE:  Obese woman with a shallow respiratory  tachypnea, no stridor.  Talkative.  Oropharynx is clear.  No JVD, no  stridor.  When encouraged to breathe  through her nose she can breathe more  deeply but then complains of a pleuritic pain in the right posterolateral  rib cage.  When she attempts to exhale briskly, she coughs with a somewhat  wheezy congested upper airway quality noise.  Heart sounds are regular  without murmur or gallop.  I do not find adenopathy.  I do not hear  pericardial friction rub.  ABDOMEN:  Quite obese, nontender.  There are old  surgical incision scars.  Bowel sounds are present.  Pelvic, rectal and  breast exams are not done.  EXTREMITIES:  Old surgical scars, heavy legs  without edema.   RADIOLOGY:  Barium swallow today reportedly negative for aspiration.  Recent  CT scan does not show evidence of acute process.   IMPRESSION:  1. Dyspnea is chronic and probably multifactorial.  Considering issues that     have not been emphasized, at least in the last admission, I would like to     try to determine how much asthmatic bronchitis she has.  There was at     least a little.  We can take a look at this with pulmonary function     tests.  Recognize her anxiety and her multiple medical problems do play a     role here.  Question whether there is a cough component related to her     Lisinopril, which could be explored by stopping the Lisinopril.  The     cough is not dramatic but some of her other complaints may be an     equivalent.  Her abdomen certainly compresses her diaphragm with an     obesity hypoventilation type of pattern, although she does not seem to be     retaining CO2.  She seems to be hyperventilating and some of her     abdominal discomforts may reflect gas/bloating in the bowel.  This might     be eased with some counseling and Simethicone.  She might benefit from a     pulmonary rehabilitation program that gives her a more proactive sense of     her ability to manage her respiratory complaints.  There is not dramatic    evidence on available studies  but with poor visualization of the right      heart on echocardiogram, I cannot completely exclude the possibility that     she has some pulmonary hypertension.  It is not likely to be severe in     the absence of severely enlarged pulmonary arteries or hypoxemia but this     raises the question of whether she would benefit from a right heart     catheterization.  She has seen Dr. Donnie Aho for cardiology evaluation in     the past, and he may be able to check his records and offer some     additional guidance.   PLAN:  I have taken the liberty of ordering a repeat blood gas and pulmonary  function tests.  Thank you for the chance to see Ms. Behney.                                               Clinton D. Maple Hudson, M.D.    CDY/MEDQ  D:  12/06/2003  T:  12/07/2003  Job:  [id number]   cc:   Hettie Holstein, D.O.  Fax: [phone]

## 2011-04-04 NOTE — Discharge Summary (Signed)
[place]  Patient:    Renee [name], Renee [name]                         MRN: [id number] Adm. Date:  [id number] Disc. Date: [id number] Attending:  Anastasio Auerbach CC:         Ty Hilts, M.D. (fax [phone])  Sleep Study Lab (fax [phone])   Discharge Summary  DISCHARGE DIAGNOSES:  1. Probable reactive airways disease exacerbation.  2. Chest tightness probably related to asthma exacerbation:  The patient     ruled out for a myocardial infarction.  3. Hypertension.  4. Dyslipidemia.  5. Suspected sleep apnea per history.  6. Normocytic anemia:  Discharge hemoglobin 11.9, hematocrit 35.  7. Status post partial left nephrectomy secondary to renal cell carcinoma in     1998:  BUN 24, creatinine 1.2 on discharge.  8. Status post Nissen fundoplication in 1997 with a repair in 1998,     postoperative complications of a pleural effusion.  9. History of loculated exudative pleural effusion requiring CT drainage in     August 1998 following Nissen fundoplication (assumed to be parapneumonic     effusion with an associated left lower lobe pneumonia). 10. Osteoarthritis, status post bilateral knee replacements. 12. Status post cholecystectomy. 13. Postmenopausal.  CONSULTATIONS:  None.  PROCEDURES:  1. Chest CT which was negative for pulmonary embolus, infiltrates,     or parenchymal disease.  2. Echocardiogram on the morning of discharge, results pending.  CONDITION ON DISCHARGE:  The patient reports markedly improved shortness of breath, persistent, but improved cough productive of white sputum.  The patients vital signs are stable, saturating 98% on room air.  Improved air movement on lung exam with slight expiratory wheeze.  REASON FOR ADMISSION:  The patient is a 70 year old white female with the above-mentioned medical problems who presented with three weeks of progressive shortness of breath, cough, and orthopnea.  She had failed at least three trials  of outpatient therapy including antibiotics, oral steroids, and nebulized medications.  She was seen at [facility] on November 18 as well as the day of admission and ultimately referred to the emergency room for evaluation and admission.  HOSPITAL COURSE: #1 - SHORTNESS OF BREATH, TACHYPNEA, AND HYPOXIA WITH SLIGHT PLEURITIC CHEST PAIN BETWEEN THE SHOULDER BLADES:  The patient was given supplemental oxygen, started on IV steroids, and scheduled nebulized Atrovent and albuterol.  Her initial respiratory rate was in the 40s and initial blood gas was 7.62/23/67/24 on room air.  The day following admission her blood gas was 7.43/30/69 and 22 saturating 94% on room air.  Given her presentation, concern for pulmonary embolus was there, and she underwent contrast chest CT which was negative for pulmonary embolus.  A chest x-ray revealed a small bilateral pleural effusion, but otherwise no evidence of parenchymal disease.  There was no evidence of pulmonary edema on exam or radiographically.  The patient improved symptomatically and was ultimately discharged on a prednisone taper as well as a combination inhaler, Advair, for management of her reactive airways disease exacerbation.  #2 - CHEST TIGHTNESS PRESUMED TO BE SECONDARY TO REACTIVE AIRWAYS DISEASE: The patients cardiac enzymes were completely negative.  EKG revealed normal sinus rhythm in the 70s, no signs of ischemia.  #3 - SUSPECTED SLEEP APNEA:  The patient provided history of apneic episodes observed by family members.  She endorsed daytime somnolence; therefore, she was referred to the sleep study lab for a  formal sleep study on December 20. Continuous pulse oximetry during her hospitalization did not reveal any marked desaturations overnight; however, she did not sleep much overnight secondary to her respiratory problems.  DISCHARGE MEDICATIONS:  1. Advair 100/50 inhaler two puffs b.i.d.  2. Albuterol one to four puffs  q.4h. p.r.n. shortness of breath.  3. Prednisone 60 mg x 2 days, 40 mg x 2 days, 20 mg x 4 days, 10 mg x 4 days,     5 mg x 4 days, then discontinue.  4. Calcium.  5. Vitamin D.  6. Os-Cal one t.i.d.  7. Accupril 40 mg p.o. q.d.  8. Lasix 20 mg p.o. q.d.  9. Paxil 20 mg p.o. q.d. 10. Enteric-coated aspirin one p.o. q.d. 11. Lorcet p.r.n.  DISCHARGE FOLLOWUP:  The patient will arrange follow-up with her primary care doctor, Dr. Ty Hilts, in [place], [place] (phone number is [phone], fax is [phone]).  Will defer to the primary care Marlin Brys regarding getting PFTs once this exacerbation is completely resolved.  The patient has a sleep study scheduled for Thursday evening 7:45 p.m., November 05, 2000, at [facility].  The patient also has an echocardiogram performed today.  Results are pending.  This was obtained to establish any evidence of pulmonary hypertension secondary to possible sleep apnea. DD:  10/09/00 TD:  10/10/00 Job: [id number]

## 2011-04-04 NOTE — Cardiovascular Report (Signed)
[place]  Patient:    Renee [name], Renee [name]                         MRN: [id number] Adm. Date:  [id number] Attending:  Noland Fordyce CC:         Cardiac Catheterization Lab  Susie Cassette, M.D., [facility] [address] [address]   Cardiac Catheterization  HISTORY:  [name] is a 70 year old moderately overweight white female with positive cardiac risk factors including hypertension and hyperlipidemia as well as a family history of heart disease who was admitted with progressive dyspnea and substernal chest pain.  She was seen in consultation on December 2 and presents now for diagnostic coronary arteriography.  DESCRIPTION OF PROCEDURE:  The patient was brought to the second floor [facility] Cardiac Catheterization Lab in the postabsorptive state.  He was premedicated with p.o. Valium.  Her right groin was prepped and shaved in the usual sterile fashion.  Xylocaine was used for local anesthesia.  A 6-French sheath was inserted into the right femoral artery using standard Seldinger technique.  The 6-French right and left Judkins diagnostic catheters, along with a 6-French pigtail catheter, were used for selective coronary angiography, left ventriculography, and distal abdominal aortography. Omnipaque dye was used for the entirety of the case. Retrograde aortic, left ventricular, and pullback pressures were recorded.  HEMODYNAMICS: 1. Aortic systolic pressure 159, diastolic pressure 86. 2. Left ventricular systolic pressure 159, diastolic pressure 20.  SELECTIVE CORONARY ANGIOGRAPHY: 1. Left main:  Normal. 2. LAD:  Normal. 3. Left circumflex:  Normal. 4. Right coronary artery:  Dominant and normal.  LEFT VENTRICULOGRAPHY:  RAO left ventriculogram was performed using 20 cc of Omnipaque dye at 10 cc per second in the RAO view.  The overall LVEF was estimated greater than 60% without focal  wall motion abnormalities.  DISTAL ABDOMINAL AORTOGRAPHY:  A distal abdominal aortogram was performed using 20 cc of Omnipaque dye at 20 cc per second.  The renal arteries are widely patent.  The infrarenal abdominal aorta and iliac bifurcation appear free of significant atherosclerotic changes.  IMPRESSION:  [name] has normal coronary arteries and normal LV function. Her chest pain is noncardiac and most likely deconditioning versus GERD-related.  The sheaths were removed and pressure was held on the groin to achieve hemostasis.  The patient left the lab in stable condition.  She will be transferred back to [facility] and will be discharged at Dr. Gerilyn Nestle discretion.  She left the lab in stable condition. DD:  10/19/00 TD:  10/19/00 Job: [id number]

## 2011-04-04 NOTE — H&P (Signed)
NAME:  Renee [name], Renee [name]                        ACCOUNT NO.:  [id number]   MEDICAL RECORD NO.:  [id number]                   PATIENT TYPE:  INP   LOCATION:  5705                                 FACILITY:  [facility]   PHYSICIAN:  Elliot Cousin, M.D.                 DATE OF BIRTH:  January 12, 1941   DATE OF ADMISSION:  11/23/2003  DATE OF DISCHARGE:                                HISTORY & PHYSICAL   PRIMARY CARE PHYSICIAN:  Juline Patch, M.D.   CHIEF COMPLAINT:  Persistent shortness of breath, wheezing, and pleuritic  chest pain.   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 Lynne Logan office today as a follow-up from the emergency department the  day before.  The patient had presented to the emergency department yesterday  with one-week history of shortness of breath on exertion and at rest,  orthopnea, pleuritic chest pain bilaterally, and wheezing.  The patient has  also had a productive cough with yellow sputum. She has had subjective  fever, but no subjective chills.  The pleuritic chest pain is on the right  and left with no associated diaphoresis, radiation down the left arm or up  to the left jaw, lightheadedness, or syncopal episodes.  She has had  associated nausea after a coughing jag.  She denies any vomiting.  No  abdominal pain, constipation, diarrhea, dysuria, or rash.  She has chronic  swelling in her legs and feet, however, she has not had a significant  increase in swelling.  When the patient was seen in the emergency department  last night per history, a chest x-ray revealed no acute disease.  Per the  patient's history, the patient was given a breathing treatment and sent home  on cough medication and hydrocodone.  The patient  presented to Dr. Lynne Logan  office with no improvement in her symptoms.  Therefore, the patient will be  admitted directly for further evaluation and management of shortness of  breath, pleuritic chest pain, and cough.   PAST MEDICAL HISTORY:  1. Asthmatic bronchitis.  2. 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  3. Hypertension.  4. Status post left partial nephrectomy secondary to renal cell carcinoma in     1998 (operation per Dr. Earlene Plater).  5. Status post Nissen fundoplication in 1997 with a repair in 1998.  6. History of loculated exudative pleural effusion, CT drainage in 1998     following the Nissen fundoplication.  7. Status post bilateral total knee replacement in 1990.  8. Status post cholecystectomy in 1998.  9. History of hyperlipidemia.  10.      Status post bilateral foot surgery for chronic tendonitis.  11.      Status post left breast biopsy in the past which was negative.  12.      Depression with anxiety.   MEDICATIONS:  1. Paxil 20 mg daily.  2. Accupril 40 mg daily.  3. Hydrocodone/APAP 5 mg/500 mg every four hours as needed for pain.  4. Os-Cal one tablet once or twice daily.  5. Advair 500/50 q.12h.  6. Coumadin 5 mg daily.   ALLERGIES:  No known drug allergies.   SOCIAL HISTORY:  The patient lives in [place], [place].  She  is single.  She has no children.  She is retired.  She denies alcohol and  drug use and tobacco use.  She can read and write a little.  She completed  the 7th grade in school.   FAMILY HISTORY:  Her mother died of congestive heart failure at 29 years of  age.  Her father died of a myocardial infarction at 78 years of age.  Her  brother is 72 years of age and has a history of coronary artery bypass  grafting and myocardial infarction.  He has one sister who is 51 years of  age and has no apparent health problems.   REVIEW OF SYSTEMS:  As above in the history of present illness.  In   addition, the patient has had no dramatic increases or decreases in her  weight.  No chronic headaches, no visual changes, no changes in her hearing.  No difficulty swallowing.  No heartburn or indigestion.  No abdominal pain,  vomiting, melena, or hematochezia.  No dysuria.  No hematuria.  Her review  of systems is positive for achiness in her knees, swelling in her legs and  feet.   PHYSICAL EXAMINATION:  VITAL SIGNS:  Temperature 98.7, pulse 119, blood  pressure 151/75, respiratory rate 18, oxygen saturation on room air 98%,  weight 214, height 5 feet 2 inches.  GENERAL:  The patient is a pleasant, obese, 70 year old Caucasian lady who  is currently sitting up in bed in no acute distress.  HEENT:  Normocephalic and atraumatic.  Pupils equal, round, and reactive to  light.  Extraocular movements are intact.  Conjunctivae are clear.  Sclerae  are white.  Nasal mucosa is moist.  Mild bilateral maxillary sinus  tenderness.  No nasal drainage and no purulent drainage.  Oropharynx reveals  moist mucous membranes.  She has a full set of dentures.  No posterior  exudates or erythema.  NECK:  Supple, no adenopathy, no thyromegaly, no bruit, and no JVD.  LUNGS:  Diffuse mild expiratory wheezes throughout all of the lung fields.  However, she does have decreased breath sounds in the bases.  Breathing is  nonlabored.  HEART:  S1 and S2 with mild tachycardia, no murmurs, rubs, or gallops.  ABDOMEN:  There are several well-healed abdominal scars.  Her abdomen is  obese.  Positive bowel sounds, soft, nontender, and nondistended.  No masses  palpated.  GENITOURINARY:  RECTAL:  Deferred.  EXTREMITIES:  The patient has bilateral prosthetic knees.  Well-healed scars  on both knees.  She has crepitus in both knees bilaterally.  She has good  range of motion with flexion and extension.  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  She has a tightening of her left  plantar surface  per her podiatrist.  Mild tenderness of the plantar surface  on the left foot.  She has a trace of pretibial and pedal edema bilaterally.  Pedal pulses are 2+ bilaterally.  She has good range of motion of all of her  muscle joints, upper extremities, and lower extremities.  NEUROLOGY:  The patient is alert and oriented x3.  Cranial nerves II-XII  grossly intact.  Strength is 5/5 in the upper and lower extremities.  Sensation is intact to soft touch.   LABORATORY DATA:  ABG on room air; pH 7.45, pCO2 33.5, pO2 70.1, BNP less  than 30. PTT 58, PT 23.9, INR 2.9.   ASSESSMENT:  1. Cough, pleuritic chest pain, and shortness of breath on exertion.  The     patient also has diffuse bronchospasms.  More than likely, the patient     has an acute exacerbation of asthmatic bronchitis.  She is currently     afebrile and does not appear to be __________.  Apparently she has failed     outpatient therapy.  She has been admitted directly for further     management.  Per Dr. Ricki Miller there is a concern regarding a potential of     pulmonary embolism.  The patient is therapeutic on Coumadin with an INR     of 2.9.  However, given the patient's symptoms and body habitus ruling     out a PE is reasonable.  2. Chronic right lower extremity thrombophlebitis.  The patient appears to     be asymptomatic at this time. There is no focal erythema or edema of     either extremity. She is currently being treated with Coumadin therapy at     5 mg daily per Dr. Ricki Miller.   PLAN:  1. The patient has been admitted to a regular bed.  2. Will treat with Solu-Medrol 80 mg q.8h and taper over the next few days.  3. Xopenex nebulizer along with Atrovent nebulizers.  The patient will be     continued on Advair 500/50 one inhalation b.i.d.  4. Empiric treatment with Azithromycin 500 mg IV daily along with Rocephin 1     gram IV daily.  5. Will check a CT scan of the chest to rule out a PE.  6. Guaifenesin and Tessalon Perles for  her cough.  7. Obtain sputum for C&S.  8. Oxygen therapy at 2 liters per minute.  9. Will anticipate elevated blood sugars on the steroid therapy.  For this     reason will add a sliding scale insulin regimen for blood sugars above     100 to 150.  10.      Will reassess the PT, INR, CBC, and CMET in the a.m.                                                Elliot Cousin, M.D.    DF/MEDQ  D:  11/23/2003  T:  11/23/2003  Job:  [id number]   cc:   Juline Patch, M.D.  [address]  [place], [place] [address]  Fax: [phone]

## 2011-04-04 NOTE — Assessment & Plan Note (Signed)
[facility]                             PULMONARY OFFICE NOTE   NAME:[name], [name]                     MRN:          [id number]  DATE:02/09/2007                            DOB:          Apr 30, 1941    HISTORY OF PRESENT ILLNESS:  Patient is a 70 year old white female  patient of Dr. Roxy [name] who has a known history of asthmatic bronchitis,  who presents for an acute office visit.  Patient complains, over the  last 3 weeks, she has had several episodes of shortness of breath,  wheezing and progressively worsening dyspnea with minimum activity.  Patient was subsequently admitted on March 17 thru March 20 at [facility].  She had an extensive workup including a CT chest  which was negative for pulmonary embolism, a Persantine Cardiolite which  was normal, and lab work which was essentially unremarkable.  Patient  has had some decreased appetite and weight loss and was felt to be  mildly dehydrated.  She has been discontinued off of her Lasix.  Patient  also underwent a C-spine x-ray of her neck, due to repeated complaints  of neck pain.  This showed some mild JVD.  A carotid ultrasound showed  some mild plaque but no obstructions.  Cardiac workup revealed no  arrhythmias, and patient was discharged on February 04, 2007.  Patient  complains that she continues to have episodes in which she feels very  anxious and has shortness of breath.  Today in the office, patient is  extremely anxious and is actually hyperventilating.  She was seen by Dr.  Juleen [name] this morning and felt to have some anxiety and given some Ativan  prescription which she has not yet filled.  Patient did undergo an ABG  on February 08, 2007, which showed a pH of 7.5, PCO2 of 19, PO2 of 108, and  an O2 saturation of 98%.  Patient denies any chest pain, orthopnea, PND,  leg swelling, vomiting, overt reflux symptoms or fever.  Patient does  complain that she felt extremely tight, as  though she can not get her  breath and very nervous.   PAST MEDICAL HISTORY:  Reviewed.   CURRENT MEDICATIONS:  Reviewed.   PHYSICAL EXAMINATION:  GENERAL:  Patient is a very anxious female in no  acute distress.  She is afebrile with stable vital signs.  VITAL SIGNS:  O2 saturation 100% on room air.  HEENT:  Unremarkable.  NECK:  Supple without cervical adenopathy.  No JVD.  LUNGS:  Sounds are clear to auscultation bilaterally without any  wheezing or crackles.  Patient does have tachypnea today in the office  but is hyperventilating.  Once patient starts to talk, and her breathing  returns back to a normal respiratory rate.  CARDIAC:  Regular rate and rhythm.  ABDOMEN:  Soft, obese and nontender.  EXTREMITIES:  Warm without calf tenderness, cyanosis, clubbing or edema.  NEURO:  Patient is alert and oriented x3.  Cranial nerves 2-12 are  intact.  Patient is extremely anxious as noted above and, however, once  patient begans to talk and starts into  a conversation, her respiratory  pattern returns to normal.   DATA:  ABG on February 08, 2007 revealed a pH of 7.54, PCO2 19, PO2 108,  bicarbonate 21, O2 saturation 98%.  Medical records from [facility] were reviewed and are on the chart.  A 2D echo on  February 02, 2007 showed mild left ventricular hypertrophy, normal left  ventricular systolic function and ejection fraction of greater of than  65%.  WBC count was 5.5, hemoglobin 11.8, BUN and creatinine 13 and 1.0  respectively.  Cardiac enzymes were negative.  CT chest on February 01, 2007 was negative for pulmonary emboli, and lungs were clear without  effusion.  Chest x-ray showed clear lungs.   IMPRESSION/PLAN:  Worsening dyspnea.  Suspect is multifactorial in  nature.  I suspect that anxiety is playing a key component of this.  Patient has had an extensive workup with recent hospitalization.  Patient was given a Xopenex nebulizer treatment in the office, and given  a  paper bag to help with the hyperventilation.  Patient is recommended  to fill the Ativan prescription that Dr. Juleen [name] gave her this morning.  Will continue on her present regimen.  She will follow back up with Dr.  Maple [name] in 2 weeks or sooner if needed.  She will continue on her  pulmonary regimen.  At this time, does not appear to be a pulmonary  issue.      Rubye Oaks, NP  Electronically Signed      Clinton D. Renee Hudson, MD, Tonny Bollman, FACP  Electronically Signed   TP/MedQ  DD: 02/10/2007  DT: 02/10/2007  Job #: [id number]

## 2011-09-22 ENCOUNTER — Encounter: Payer: Self-pay | Admitting: Internal Medicine

## 2011-09-22 ENCOUNTER — Other Ambulatory Visit (INDEPENDENT_AMBULATORY_CARE_PROVIDER_SITE_OTHER): Payer: Medicare Other

## 2011-09-22 ENCOUNTER — Ambulatory Visit (INDEPENDENT_AMBULATORY_CARE_PROVIDER_SITE_OTHER): Payer: Medicare Other | Admitting: Internal Medicine

## 2011-09-22 VITALS — BP 116/64 | HR 95 | Ht 62.0 in | Wt 193.2 lb

## 2011-09-22 DIAGNOSIS — R0609 Other forms of dyspnea: Secondary | ICD-10-CM

## 2011-09-22 DIAGNOSIS — R0600 Dyspnea, unspecified: Secondary | ICD-10-CM

## 2011-09-22 DIAGNOSIS — J45909 Unspecified asthma, uncomplicated: Secondary | ICD-10-CM

## 2011-09-22 DIAGNOSIS — R0602 Shortness of breath: Secondary | ICD-10-CM

## 2011-09-22 DIAGNOSIS — R0989 Other specified symptoms and signs involving the circulatory and respiratory systems: Secondary | ICD-10-CM

## 2011-09-22 DIAGNOSIS — Z8672 Personal history of thrombophlebitis: Secondary | ICD-10-CM

## 2011-09-22 LAB — BASIC METABOLIC PANEL
BUN: 15 mg/dL (ref 6–23)
Chloride: 106 mEq/L (ref 96–112)
Glucose, Bld: 125 mg/dL — ABNORMAL HIGH (ref 70–99)
Potassium: 3.4 mEq/L — ABNORMAL LOW (ref 3.5–5.1)

## 2011-09-22 LAB — CBC WITH DIFFERENTIAL/PLATELET
Basophils Relative: 0.4 % (ref 0.0–3.0)
Eosinophils Relative: 2.5 % (ref 0.0–5.0)
HCT: 32.8 % — ABNORMAL LOW (ref 36.0–46.0)
Hemoglobin: 10.7 g/dL — ABNORMAL LOW (ref 12.0–15.0)
MCHC: 32.6 g/dL (ref 30.0–36.0)
MCV: 80.2 fl (ref 78.0–100.0)
Monocytes Relative: 5.5 % (ref 3.0–12.0)
Platelets: 216 10*3/uL (ref 150.0–400.0)
RDW: 16.7 % — ABNORMAL HIGH (ref 11.5–14.6)

## 2011-09-22 LAB — D-DIMER, QUANTITATIVE: D-Dimer, Quant: 0.73 ug/mL-FEU — ABNORMAL HIGH (ref 0.00–0.48)

## 2011-09-22 NOTE — Progress Notes (Signed)
Patient ID: Renee [name], female    DOB: 14-Sep-1941, 70 y.o.   MRN: [id number]   03/18/11- 77 yoF with asthma, hx of DVT and significant anxiety. Last seen 02/26/11-Sister here. I was called by a Hospitalist in [place] recently when she was admitted. He felt the problem was mainly anxiety, but treated her for possible asthma. CT was reported negative for PE and tests were negative for any acute heart . Today- Sister here again. "The shortness left a week ago" and she hasn't needed her breathing meds at all since then. She doesn't know what made the difference. Still notes easy DOE making beds and walking to mailbox.   09/22/11- 69 yoF with asthma, hx of DVT and significant anxiety. Complains of persistent dyspnea with exertion since last here. Little cough or wheeze. Sometimes her rescue inhaler seems to help, and has used her nebulizer once. She notes that several family members have had heart disease.  For the past week she's had increased sinus congestion with "scabs" from her nose. Sometimes feels heart pound. After Spiriva she is able to cough out some dark green sputum. Occasionally wakes smothered. Legs swell, more some days than others, but not bad compared with past history. She gets cramps in her calves and hands. Denies chest pain or choking. Echocardiogram in 2005 indicated ejection fraction 55-65%. CT chest 01/25/2011 at [facility] was negative for PE, clear lung fields except for a 4 mm right lung nodule, benign.  Review of Systems  See HPI Constitutional:   No-   weight loss, night sweats, fevers, chills, fatigue, lassitude. HEENT:   No-  headaches, difficulty swallowing, tooth/dental problems, sore throat,       No-  sneezing, itching, ear ache, nasal congestion, post nasal drip,  CV:  No-   chest pain, orthopnea, PND, +swelling in lower extremities, No- anasarca, dizziness, palpitations Resp: + chronic shortness of breath with exertion and at rest.              No-    productive cough,  No non-productive cough,  No- coughing up of blood.              No- sustained  change in color of mucus.  No- wheezing.   Skin: No-   rash or lesions. GI:  No-   heartburn, indigestion, abdominal pain, nausea, vomiting, diarrhea,                 change in bowel habits, loss of appetite GU: No-   dysuria, change in color of urine, no urgency or frequency.  No- flank pain. MS:  No-   joint pain or swelling.  No- decreased range of motion.  No- back pain. Neuro-     nothing unusual Psych:  No- change in mood or affect. No depression or anxiety.  No memory loss.       Objective:   Physical Exam General- Alert, Oriented, Affect-appropriate, Distress- none acute; obese Skin- rash-none, lesions- none, excoriation- none Lymphadenopathy- none Head- atraumatic            Eyes- Gross vision intact, PERRLA, conjunctivae clear secretions            Ears- Hearing, canals-normal            Nose- Clear, no-Septal dev, mucus, polyps, erosion, perforation             Throat- Mallampati II , mucosa clear , drainage- none, tonsils- atrophic Neck- flexible , trachea midline, no  stridor , thyroid nl, carotid no bruit Chest - symmetrical excursion , unlabored           Heart/CV- RRR , no murmur , no gallop  , no rub, nl s1 s2                           - JVD- 1 cm , edema- 1+, stasis changes- none, varices- none           Lung- few basilar crackles, wheeze- none, hoarse cough , dullness-none, rub- none           Chest wall-  Abd- tender-no, distended-no, bowel sounds-present, HSM- no Br/ Gen/ Rectal- Not done, not indicated Extrem- cyanosis- none, clubbing, none, atrophy- none, strength- nl; old TKR knee scars, soft varices right calf,    negative Homans bilaterally Neuro- grossly intact to observation

## 2011-09-22 NOTE — Patient Instructions (Signed)
Order- PFT and 6 MWT                                 - dx dyspnea, asthma              Lab- CBC w/ diff, BMET, D-dimer, BNP

## 2011-09-23 NOTE — Assessment & Plan Note (Signed)
Her obesity with obesity hypoventilation, deconditioning, and anxiety certainly contribute. We needed better understanding of her respiratory reserve. Plan-schedule PFT and 6 minute walk test, CBC, chemistry, BNP

## 2011-09-23 NOTE — Assessment & Plan Note (Signed)
I doubt new DVT/PE but will recheck D. dimer

## 2011-10-01 NOTE — Progress Notes (Signed)
Quick Note:  Pt aware of results. ______ 

## 2011-10-02 ENCOUNTER — Telehealth: Payer: Self-pay | Admitting: Internal Medicine

## 2011-10-02 NOTE — Telephone Encounter (Signed)
ATC # above.  NA and No option to leave message.  WCB

## 2011-10-03 NOTE — Telephone Encounter (Signed)
Ok to speak to sister per patient-gave her sister results again.

## 2011-10-30 ENCOUNTER — Ambulatory Visit: Payer: Medicare Other

## 2011-10-31 ENCOUNTER — Ambulatory Visit (HOSPITAL_COMMUNITY)
Admission: RE | Admit: 2011-10-31 | Discharge: 2011-10-31 | Disposition: A | Discharge status: Home / Self Care | Payer: Medicare Other | Source: Ambulatory Visit | Attending: Internal Medicine | Admitting: Internal Medicine

## 2011-10-31 ENCOUNTER — Ambulatory Visit (INDEPENDENT_AMBULATORY_CARE_PROVIDER_SITE_OTHER): Payer: Medicare Other | Admitting: Internal Medicine

## 2011-10-31 ENCOUNTER — Encounter: Payer: Self-pay | Admitting: Internal Medicine

## 2011-10-31 VITALS — BP 116/72 | HR 100 | Ht 62.0 in | Wt 184.0 lb

## 2011-10-31 DIAGNOSIS — J45909 Unspecified asthma, uncomplicated: Secondary | ICD-10-CM

## 2011-10-31 DIAGNOSIS — R0602 Shortness of breath: Secondary | ICD-10-CM

## 2011-10-31 DIAGNOSIS — R0609 Other forms of dyspnea: Secondary | ICD-10-CM

## 2011-10-31 DIAGNOSIS — R0600 Dyspnea, unspecified: Secondary | ICD-10-CM

## 2011-10-31 DIAGNOSIS — R942 Abnormal results of pulmonary function studies: Secondary | ICD-10-CM | POA: Insufficient documentation

## 2011-10-31 DIAGNOSIS — R0989 Other specified symptoms and signs involving the circulatory and respiratory systems: Secondary | ICD-10-CM | POA: Insufficient documentation

## 2011-10-31 MED ORDER — LEVALBUTEROL HCL 0.63 MG/3ML IN NEBU
0.6300 mg | INHALATION_SOLUTION | Freq: Once | RESPIRATORY_TRACT | Status: AC
  Administered 2011-10-31: 0.63 mg via RESPIRATORY_TRACT
  Filled 2011-10-31: qty 3

## 2011-10-31 NOTE — Patient Instructions (Signed)
Foods good for giving you potassium-  Oranges, spinach, tomatoes, bananas  I will watch for the result of your Pulmonary Function Test

## 2011-10-31 NOTE — Progress Notes (Signed)
Patient ID: Renee [name], female    DOB: 1941-01-09, 70 y.o.   MRN: [id number]   03/18/11- 72 yoF with asthma, hx of DVT and significant anxiety. Last seen 02/26/11-Sister here. I was called by a Hospitalist in [place] recently when she was admitted. He felt the problem was mainly anxiety, but treated her for possible asthma. CT was reported negative for PE and tests were negative for any acute heart . Today- Sister here again. "The shortness left a week ago" and she hasn't needed her breathing meds at all since then. She doesn't know what made the difference. Still notes easy DOE making beds and walking to mailbox.   09/22/11- 69 yoF with asthma, hx of DVT and significant anxiety. Complains of persistent dyspnea with exertion since last here. Little cough or wheeze. Sometimes her rescue inhaler seems to help, and has used her nebulizer once. She notes that several family members have had heart disease.  For the past week she's had increased sinus congestion with "scabs" from her nose. Sometimes feels heart pound. After Spiriva she is able to cough out some dark green sputum. Occasionally wakes smothered. Legs swell, more some days than others, but not bad compared with past history. She gets cramps in her calves and hands. Denies chest pain or choking. Echocardiogram in 2005 indicated ejection fraction 55-65%. CT chest 01/25/2011 at [facility] was negative for PE, clear lung fields except for a 4 mm right lung nodule, benign.  10/31/11- 69 yoF with asthma, chronic dyspnea,  hx of DVT and significant anxiety Sister here. Has had flu vaccine Still complains of shortness of breath as she has ever since spring-easy shortness of breath with exertion but also at rest. Little change from day to day. Occasionally wakes feeling strangled or wheeze he and his feet are swelling more. Uses a diuretic when she thinks she needs to. Watery rhinorrhea. Denies chest pain, palpitation, productive cough or  discolored sputum, fever or swollen glands, bleeding. Sr. reminds me that patient was hospitalized elsewhere in the spring with workup including CT scan but no findings to explain her complaints of dyspnea which they attributed to anxiety. D-dimer 0.73 BMET- ok  CBC- Hgb 10.7 BNP- 29 6 Minute Walk Test 10/31/11- normal oxygenation, starting at 99%, ending at 100% with recovery 2 minutes later 100%, walk distance 408 m.   Review of Systems  See HPI Constitutional:   No-   weight loss, night sweats, fevers, chills, fatigue, lassitude. HEENT:   No-  headaches, difficulty swallowing, tooth/dental problems, sore throat,       No-  sneezing, itching, ear ache, nasal congestion, post nasal drip,  CV:  No-   chest pain, orthopnea, PND, +swelling in lower extremities, No- anasarca, dizziness, palpitations Resp: + chronic shortness of breath with exertion and at rest.              No-   productive cough,  No non-productive cough,  No- coughing up of blood.              No- sustained  change in color of mucus.  No- wheezing.   Skin: No-   rash or lesions. GI:  No-   heartburn, indigestion, abdominal pain, nausea, vomiting, diarrhea,                 change in bowel habits, loss of appetite GU: No-   dysuria, change in color of urine, no urgency or frequency.  No- flank pain. MS:  No-   joint pain or swelling.  No- decreased range of motion.  No- back pain. Neuro-     nothing unusual Psych:  No- change in mood or affect. No depression or anxiety.  No memory loss.       Objective:   Physical Exam General- Alert, Oriented, Affect-appropriate, Distress- none acute; obese Skin- rash-none, lesions- none, excoriation- none Lymphadenopathy- none Head- atraumatic            Eyes- Gross vision intact, PERRLA, conjunctivae clear secretions            Ears- Hearing, canals-normal            Nose- Clear, no-Septal dev, mucus, polyps, erosion, perforation             Throat- Mallampati II , mucosa clear ,  drainage- none, tonsils- atrophic Neck- flexible , trachea midline, no stridor , thyroid nl, carotid no bruit Chest - symmetrical excursion , unlabored           Heart/CV- RRR , no murmur , no gallop  , no rub, nl s1 s2                           - JVD- 1 cm , edema- 1+, stasis changes- none, varices- none           Lung- few basilar crackles, wheeze- none, hoarse cough , dullness-none, rub- none, shallow tachypnea could breathing pattern which seems distractible.           Chest wall-  Abd- tender-no, distended-no, bowel sounds-present, HSM- no Br/ Gen/ Rectal- Not done, not indicated Extrem- cyanosis- none, clubbing, none, atrophy- none, strength- nl; old TKR knee scars, soft varices right calf,    negative Homans bilaterally Neuro- grossly intact to observation

## 2011-11-02 NOTE — Assessment & Plan Note (Signed)
She has had significant workup with no findings. PFT was scheduled and will be done today. 6 minute walk test substantially rules out true hypoxemia.  Consider ABG on room air looking for central hyperventilation.

## 2011-11-04 NOTE — Assessment & Plan Note (Signed)
For 6 minute walk test 

## 2011-11-17 ENCOUNTER — Encounter: Payer: Self-pay | Admitting: Internal Medicine

## 2012-04-30 ENCOUNTER — Ambulatory Visit: Payer: Medicare Other | Admitting: Internal Medicine

## 2012-06-29 ENCOUNTER — Encounter: Payer: Self-pay | Admitting: Internal Medicine

## 2012-06-29 ENCOUNTER — Ambulatory Visit (INDEPENDENT_AMBULATORY_CARE_PROVIDER_SITE_OTHER): Payer: Medicare Other | Admitting: Internal Medicine

## 2012-06-29 VITALS — BP 150/78 | HR 73 | Ht 62.0 in | Wt 185.2 lb

## 2012-06-29 DIAGNOSIS — J45909 Unspecified asthma, uncomplicated: Secondary | ICD-10-CM

## 2012-06-29 DIAGNOSIS — J309 Allergic rhinitis, unspecified: Secondary | ICD-10-CM

## 2012-06-29 DIAGNOSIS — J45998 Other asthma: Secondary | ICD-10-CM

## 2012-06-29 NOTE — Progress Notes (Signed)
Patient ID: Renee [name], female    DOB: 06-06-1941, 71 y.o.   MRN: [id number]   03/18/11- 62 yoF with asthma, hx of DVT and significant anxiety. Last seen 02/26/11-Sister here. I was called by a Hospitalist in [place] recently when she was admitted. He felt the problem was mainly anxiety, but treated her for possible asthma. CT was reported negative for PE and tests were negative for any acute heart . Today- Sister here again. "The shortness left a week ago" and she hasn't needed her breathing meds at all since then. She doesn't know what made the difference. Still notes easy DOE making beds and walking to mailbox.   09/22/11- 69 yoF with asthma, hx of DVT and significant anxiety. Complains of persistent dyspnea with exertion since last here. Little cough or wheeze. Sometimes her rescue inhaler seems to help, and has used her nebulizer once. She notes that several family members have had heart disease.  For the past week she's had increased sinus congestion with "scabs" from her nose. Sometimes feels heart pound. After Spiriva she is able to cough out some dark green sputum. Occasionally wakes smothered. Legs swell, more some days than others, but not bad compared with past history. She gets cramps in her calves and hands. Denies chest pain or choking. Echocardiogram in 2005 indicated ejection fraction 55-65%. CT chest 01/25/2011 at [facility] was negative for PE, clear lung fields except for a 4 mm right lung nodule, benign.  10/31/11- 69 yoF with asthma, chronic dyspnea,  hx of DVT and significant anxiety Sister here. Has had flu vaccine Still complains of shortness of breath as she has ever since spring-easy shortness of breath with exertion but also at rest. Little change from day to day. Occasionally wakes feeling strangled or wheeze he and his feet are swelling more. Uses a diuretic when she thinks she needs to. Watery rhinorrhea. Denies chest pain, palpitation, productive cough or  discolored sputum, fever or swollen glands, bleeding. Sr. reminds me that patient was hospitalized elsewhere in the spring with workup including CT scan but no findings to explain her complaints of dyspnea which they attributed to anxiety. D-dimer 0.73 BMET- ok  CBC- Hgb 10.7 BNP- 29 6 Minute Walk Test 10/31/11- normal oxygenation, starting at 99%, ending at 100% with recovery 2 minutes later 100%, walk distance 408 m.  06/29/12- 70 yoF with asthma, chronic dyspnea,  hx of DVT and significant anxiety States doing better. Denies sob, cough, chest pain, chest tightness, and wheezing.  We reviewed her boy functions from last December. She has stayed in some with heat and humidity but has been able to keep her garden this year. She is off of several of her medications and very pleased with her status. Still using Spiriva each morning with occasional use of a rescue inhaler and uses Advair once daily. PFT: 10/31/2011 normal spirometry scores, FEV1/FVC oh 0.85 with insignificant response to bronchodilator. Diffusion moderately reduced 0.63  Review of Systems -See HPI Constitutional:   No-   weight loss, night sweats, fevers, chills, fatigue, lassitude. HEENT:   No-  headaches, difficulty swallowing, tooth/dental problems, sore throat,       No-  sneezing, itching, ear ache, nasal congestion, post nasal drip,  CV:  No-   chest pain, orthopnea, PND, +swelling in lower extremities, No- anasarca, dizziness, palpitations Resp: + chronic shortness of breath with exertion and at rest.              No-  productive cough,  No non-productive cough,  No- coughing up of blood.              No- sustained  change in color of mucus.  No- wheezing.   Skin: No-   rash or lesions. GI:  No-   heartburn, indigestion, abdominal pain, nausea, vomiting,  GU: . MS:  No-   joint pain or swelling.   Neuro-     nothing unusual Psych:  No- change in mood or affect. No depression or anxiety.  No memory loss.   Objective:    Physical Exam General- Alert, Oriented, Affect-appropriate, Distress- none acute; obese Skin- rash-none, lesions- none, excoriation- none Lymphadenopathy- none Head- atraumatic            Eyes- Gross vision intact, PERRLA, conjunctivae clear secretions            Ears- Hearing, canals-normal            Nose- Clear, no-Septal dev, mucus, polyps, erosion, perforation             Throat- Mallampati II , mucosa clear , drainage- none, tonsils- atrophic Neck- flexible , trachea midline, no stridor , thyroid nl, carotid no bruit Chest - symmetrical excursion , unlabored           Heart/CV- RRR , no murmur , no gallop  , no rub, nl s1 s2                           - JVD- 1 cm , edema- 1+, stasis changes- none, varices- none           Lung- clear, wheeze- none, hoarse cough , dullness-none, rub- none, shallow tachypnea could breathing pattern which seems distractible.           Chest wall-  Abd-  Br/ Gen/ Rectal- Not done, not indicated Extrem- cyanosis- none, clubbing, none, atrophy- none, strength- nl; old TKR knee scars, soft varices right calf,    negative Homans bilaterally Neuro- grossly intact to observation

## 2012-06-29 NOTE — Patient Instructions (Addendum)
Ok to continue present inhalers.   Please call as needed

## 2012-06-30 ENCOUNTER — Ambulatory Visit: Payer: Medicare Other | Admitting: Internal Medicine

## 2012-07-06 NOTE — Assessment & Plan Note (Signed)
Good symptomatic control the summer. We discussed the effect of the cool rainy weather

## 2012-07-06 NOTE — Assessment & Plan Note (Signed)
Good control this year. We recognize her anxiety component but I think her medications do help

## 2012-12-08 ENCOUNTER — Telehealth: Payer: Self-pay | Admitting: Oncology

## 2012-12-08 NOTE — Telephone Encounter (Signed)
S/W pt sister in re NP appt 2/10 @ 10:30 w/Dr. Gaylyn Rong.  Referring Dr. Juleen China Dx-Abn CBC/Dif Welcome packet mailed.

## 2012-12-08 NOTE — Telephone Encounter (Signed)
C/D 12/08/12 for appt.12/27/12

## 2012-12-14 ENCOUNTER — Other Ambulatory Visit: Payer: Self-pay | Admitting: Oncology

## 2012-12-14 ENCOUNTER — Encounter: Payer: Self-pay | Admitting: Internal Medicine

## 2012-12-14 ENCOUNTER — Ambulatory Visit (INDEPENDENT_AMBULATORY_CARE_PROVIDER_SITE_OTHER): Payer: Medicare Other | Admitting: Internal Medicine

## 2012-12-14 ENCOUNTER — Encounter: Payer: Self-pay | Admitting: Oncology

## 2012-12-14 ENCOUNTER — Telehealth: Payer: Self-pay | Admitting: Internal Medicine

## 2012-12-14 VITALS — BP 122/80 | HR 77 | Ht 62.0 in | Wt 195.8 lb

## 2012-12-14 DIAGNOSIS — J45909 Unspecified asthma, uncomplicated: Secondary | ICD-10-CM

## 2012-12-14 DIAGNOSIS — D72828 Other elevated white blood cell count: Secondary | ICD-10-CM

## 2012-12-14 DIAGNOSIS — J209 Acute bronchitis, unspecified: Secondary | ICD-10-CM

## 2012-12-14 HISTORY — DX: Other elevated white blood cell count: D72.828

## 2012-12-14 MED ORDER — LEVALBUTEROL HCL 0.63 MG/3ML IN NEBU: 1.0000 | INHALATION_SOLUTION | Freq: Four times a day (QID) | RESPIRATORY_TRACT | Status: DC | PRN

## 2012-12-14 NOTE — Telephone Encounter (Signed)
Pt ID # [id number] A.  I spoke with [facility] Billing at [phone] and was told that the diagnosis codes we provided on RX do not meet criteria for this medication. I asked if the 493.10 or allergic infective asthma would work and was told that does qualify for the Xopenex nebs. They will be contacting the pharmacy and have a form sent over for CY to sign and they will also try to get reimbursement for the pt since they did pay out of pocket for this. Diagnosis code has been updated on the RX for future prescriptions.   ATC Pauletta Browns to make her aware.

## 2012-12-14 NOTE — Telephone Encounter (Signed)
Spoke with patients sister, patients sister states that patients Medicare will not cover Xopenex Neb and will need Prior Auth done. However spoke with pharmacy and they stated that patient sister has now already picked up Rx and paid for it cash so they are unsure if prior auth can done now.  On hold over 5 min with pharmacy for further clarification and see if patient can get reimbursement. WCB

## 2012-12-14 NOTE — Patient Instructions (Addendum)
Finish the Zpak antibiotic  Use the cough medicine as needed  Script refill Xopenex neb solution sent  Please call as needed

## 2012-12-14 NOTE — Progress Notes (Signed)
Patient ID: TELIAH BUFFALO, female    DOB: [birth year], 72 y.o.   MRN: [id number]   03/18/11- 37 yoF with asthma, hx of DVT and significant anxiety. Last seen 02/26/11-Sister here. I was called by a Hospitalist in [place] recently when she was admitted. He felt the problem was mainly anxiety, but treated her for possible asthma. CT was reported negative for PE and tests were negative for any acute heart . Today- Sister here again. "The shortness left a week ago" and she hasn't needed her breathing meds at all since then. She doesn't know what made the difference. Still notes easy DOE making beds and walking to mailbox.   09/22/11- 69 yoF with asthma, hx of DVT and significant anxiety. Complains of persistent dyspnea with exertion since last here. Little cough or wheeze. Sometimes her rescue inhaler seems to help, and has used her nebulizer once. She notes that several family members have had heart disease.  For the past week she's had increased sinus congestion with "scabs" from her nose. Sometimes feels heart pound. After Spiriva she is able to cough out some dark green sputum. Occasionally wakes smothered. Legs swell, more some days than others, but not bad compared with past history. She gets cramps in her calves and hands. Denies chest pain or choking. Echocardiogram in 2005 indicated ejection fraction 55-65%. CT chest 01/25/2011 at [facility] was negative for PE, clear lung fields except for a 4 mm right lung nodule, benign.  10/31/11- 69 yoF with asthma, chronic dyspnea,  hx of DVT and significant anxiety Sister here. Has had flu vaccine Still complains of shortness of breath as she has ever since spring-easy shortness of breath with exertion but also at rest. Little change from day to day. Occasionally wakes feeling strangled or wheeze he and his feet are swelling more. Uses a diuretic when she thinks she needs to. Watery rhinorrhea. Denies chest pain, palpitation, productive cough or  discolored sputum, fever or swollen glands, bleeding. Sr. reminds me that patient was hospitalized elsewhere in the spring with workup including CT scan but no findings to explain her complaints of dyspnea which they attributed to anxiety. D-dimer 0.73 BMET- ok  CBC- Hgb 10.7 BNP- 29 6 Minute Walk Test 10/31/11- normal oxygenation, starting at 99%, ending at 100% with recovery 2 minutes later 100%, walk distance 408 m.  06/29/12- 70 yoF with asthma, chronic dyspnea,  hx of DVT and significant anxiety States doing better. Denies sob, cough, chest pain, chest tightness, and wheezing.  We reviewed her boy functions from last December. She has stayed in some with heat and humidity but has been able to keep her garden this year. She is off of several of her medications and very pleased with her status. Still using Spiriva each morning with occasional use of a rescue inhaler and uses Advair once daily. PFT: 10/31/2011 normal spirometry scores, FEV1/FVC oh 0.85 with insignificant response to bronchodilator. Diffusion moderately reduced 0.63  12/14/12- 71 yoF with asthma, chronic dyspnea,  hx of DVT and significant anxiety FOLLOWS FOR: has had to go to UC back home 2 times since seeing [place] last(started with sinus pressure, fluid in ears, and red throat-give Guaiatussin AC Syrup , Tessalon pearls, and Zpak on  12-12-12) Has Bronchitis. Increased SOB, wheezing, cough (mainly at night). Also, told to go back to using  Xopenex 0.63mg  (needs Refill as her Rx has expired) Now on Z-Pak. Being evaluated for anemia. Albuterol causes too much tremor.  Review of Systems -  See HPI Constitutional:   No-   weight loss, night sweats, fevers, chills, fatigue, lassitude. HEENT:   No-  headaches, difficulty swallowing, tooth/dental problems, sore throat,       No-  sneezing, itching, ear ache, nasal congestion, post nasal drip,  CV:  No-   chest pain, orthopnea, PND, +swelling in lower extremities, No- anasarca, dizziness,  palpitations Resp: + chronic shortness of breath with exertion and at rest.              + productive cough,  + non-productive cough,  No- coughing up of blood.              No- sustained  change in color of mucus.  + wheezing.   Skin: No-   rash or lesions. GI:  No-   heartburn, indigestion, abdominal pain, nausea, vomiting,  GU: . MS:  No-   joint pain or swelling.   Neuro-     nothing unusual Psych:  No- change in mood or affect. No depression or anxiety.  No memory loss.   Objective:   Physical Exam General- Alert, Oriented, Affect-appropriate, Distress- none acute; obese Skin- rash-none, lesions- none, excoriation- none Lymphadenopathy- none Head- atraumatic            Eyes- Gross vision intact, PERRLA, conjunctivae clear secretions            Ears- Hearing, canals-normal            Nose- Clear, no-Septal dev, mucus, polyps, erosion, perforation             Throat- Mallampati II , +mucosa red , drainage- none, tonsils- atrophic. Dentures Neck- flexible , trachea midline, no stridor , thyroid nl, carotid no bruit Chest - symmetrical excursion , unlabored           Heart/CV- RRR , no murmur , no gallop  , no rub, nl s1 s2                           - JVD- 1 cm , edema- 1+, stasis changes- none, varices- none           Lung- clear, wheeze- none, hoarse cough , dullness-none, rub- none.           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

## 2012-12-15 MED ORDER — LEVALBUTEROL HCL 0.63 MG/3ML IN NEBU: 1.0000 | INHALATION_SOLUTION | Freq: Four times a day (QID) | RESPIRATORY_TRACT | Status: AC | PRN

## 2012-12-15 NOTE — Telephone Encounter (Signed)
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

## 2012-12-15 NOTE — Telephone Encounter (Signed)
Renee [name] aware this has been taken care of at the pharmacy and that they are working on getting reimbursement for the money paid out of pocket. Will forward to CY and nurse so they know that a form should be coming from [facility] with the correct diagnosis code that covered the medication. Pls have CY sign and then fax back. Pls note that dyspnea and acute asthmatic bronchitis does not work for [facility] coverage of nebulizer medications. Will forward to Florentina Addison so she can follow-up on this form.

## 2012-12-17 NOTE — Telephone Encounter (Signed)
Pls advise if form has been received.

## 2012-12-20 NOTE — Telephone Encounter (Signed)
I have not gotten any papers on this patient. Will you guys please call them and let them know to refax URGENT. Thanks :)

## 2012-12-20 NOTE — Telephone Encounter (Signed)
Per [facility] Billing, they have everything that they need to reimburse the pt and once everything has finished processing the pt will receive a check in the mail. Pt and sister, Cleda Mccreedy, aware of this and verbalized understanding.

## 2012-12-23 NOTE — Assessment & Plan Note (Signed)
This time of year would be consistent with a viral upper and lower respiratory infection with bronchitis but probably not pneumonia. Plan-finished Z-Pak, refill cough medicine. Xopenex nebulizer treatment. Extra fluids.

## 2012-12-27 ENCOUNTER — Ambulatory Visit (HOSPITAL_BASED_OUTPATIENT_CLINIC_OR_DEPARTMENT_OTHER): Payer: Medicare Other | Admitting: Oncology

## 2012-12-27 ENCOUNTER — Ambulatory Visit: Payer: Medicare Other

## 2012-12-27 ENCOUNTER — Encounter: Payer: Self-pay | Admitting: Oncology

## 2012-12-27 ENCOUNTER — Other Ambulatory Visit (HOSPITAL_BASED_OUTPATIENT_CLINIC_OR_DEPARTMENT_OTHER): Payer: Medicare Other | Admitting: Lab

## 2012-12-27 ENCOUNTER — Telehealth: Payer: Self-pay | Admitting: Oncology

## 2012-12-27 VITALS — BP 153/100 | HR 85 | Temp 97.2°F | Resp 22 | Ht 62.0 in | Wt 196.4 lb

## 2012-12-27 DIAGNOSIS — D72828 Other elevated white blood cell count: Secondary | ICD-10-CM

## 2012-12-27 DIAGNOSIS — R6889 Other general symptoms and signs: Secondary | ICD-10-CM

## 2012-12-27 DIAGNOSIS — D72829 Elevated white blood cell count, unspecified: Secondary | ICD-10-CM

## 2012-12-27 DIAGNOSIS — D649 Anemia, unspecified: Secondary | ICD-10-CM

## 2012-12-27 DIAGNOSIS — I10 Essential (primary) hypertension: Secondary | ICD-10-CM

## 2012-12-27 LAB — CBC WITH DIFFERENTIAL/PLATELET
BASO%: 0.4 % (ref 0.0–2.0)
EOS%: 2.8 % (ref 0.0–7.0)
HCT: 34.3 % — ABNORMAL LOW (ref 34.8–46.6)
LYMPH%: 16.7 % (ref 14.0–49.7)
MCH: 28.9 pg (ref 25.1–34.0)
MCHC: 33.3 g/dL (ref 31.5–36.0)
MCV: 86.7 fL (ref 79.5–101.0)
NEUT%: 73.6 % (ref 38.4–76.8)
Platelets: 165 10*3/uL (ref 145–400)

## 2012-12-27 LAB — CHCC SMEAR

## 2012-12-27 NOTE — Telephone Encounter (Signed)
gv and printed appt schedule for pt for May, Aug, Nov and Feb 2015

## 2012-12-27 NOTE — Progress Notes (Signed)
[facility]  Telephone:([phone]) [phone] Fax:([phone]) [phone]     INITIAL HEMATOLOGY CONSULTATION    Referral MD:  Dr. Darci Needle, M.D.  Reason for Referral: left shift, neutrophilia.     HPI:  Renee [name]. Renee [name] is a 72 year old woman with chronic bronchitis.  Dr. Juleen China visited with the patient and sent a routine CBC on 11/29/2012 which showed WBC 10.3; Hgb 11.0; Plt 174; 9% band, 65% PMN, 14% lymph.  Given the abnormal differentials, she was kindly referred to the [facility] for evaluation.   Renee [name] presented to the clinic for the first time today with her sister.  She reported having chronic cough, SOB.  She has a time episodes of "bronchitis" a year requiring oral antibiotics.  She recently finished a course of antibiotics about 2 weeks ago. She feels much better with less productive cough and sinus pain the last few days.   Patient denies fever, anorexia, weight loss, fatigue, headache, visual changes, confusion, drenching night sweats, palpable lymph node swelling, mucositis, odynophagia, dysphagia, nausea vomiting, jaundice, chest pain, palpitation, shortness of breath, dyspnea on exertion, gum bleeding, epistaxis, hematemesis, hemoptysis, abdominal pain, abdominal swelling, early satiety, melena, hematochezia, hematuria, skin rash, spontaneous bleeding, joint swelling, joint pain, heat or cold intolerance, bowel bladder incontinence, back pain, focal motor weakness, paresthesia, depression.      Past Medical History  Diagnosis Date  . DVT (deep venous thrombosis)     Right leg. x 1.  off of Coumadin now.   . Generalized anxiety disorder   . Acute bronchitis   . Shortness of breath   . Granulocytosis 12/14/2012  . Hypertension   . Hyperlipidemia   . Colonic polyp   :    Past Surgical History  Procedure Laterality Date  . Partial nephrectomy Left 2009    kidney cancer.   . Hiatal hernia repair      x 2   . Cholecystectomy    . Breast biopsy       left  . Foot surgery    . Colonoscopy  2011    next colonscopy in 2015.   :   CURRENT MEDS: Current Outpatient Prescriptions  Medication Sig Dispense Refill  . albuterol (PROAIR HFA) 108 (90 BASE) MCG/ACT inhaler Inhale 2 puffs into the lungs 4 (four) times daily.        Marland Kitchen aspirin 81 MG tablet Take 81 mg by mouth daily.        Marland Kitchen b complex vitamins tablet Take 1 tablet by mouth daily.      . benzonatate (TESSALON) 100 MG capsule Take 100 mg by mouth 3 (three) times daily as needed for cough.      . Cholecalciferol (VITAMIN D3) 1000 UNITS CAPS Take 1 capsule by mouth daily.        . ferrous sulfate 325 (65 FE) MG EC tablet Take 325 mg by mouth daily with breakfast.      . Fluticasone-Salmeterol (ADVAIR) 250-50 MCG/DOSE AEPB Inhale 1 puff into the lungs daily.      . furosemide (LASIX) 40 MG tablet Take 40 mg by mouth daily.        Marland Kitchen HYDROcodone-acetaminophen (VICODIN) 5-500 MG per tablet Take 1 tablet by mouth every 6 (six) hours as needed.        . levalbuterol (XOPENEX) 0.63 MG/3ML nebulizer solution Take 3 mLs (0.63 mg total) by nebulization every 6 (six) hours as needed. DX: 493.10,  File under MCR PART B  150  mL  6  . omeprazole (PRILOSEC) 20 MG capsule Take 20 mg by mouth as needed.       Marland Kitchen PARoxetine (PAXIL-CR) 37.5 MG 24 hr tablet Take 37.5 mg by mouth every morning.      . potassium chloride (K-DUR,KLOR-CON) 10 MEQ tablet Take 10 mEq by mouth 2 (two) times daily.      . rosuvastatin (CRESTOR) 10 MG tablet Take 10 mg by mouth daily.        Marland Kitchen tiotropium (SPIRIVA) 18 MCG inhalation capsule Place 18 mcg into inhaler and inhale daily.        . vitamin E 1000 UNIT capsule Take 1,000 Units by mouth daily.       No current facility-administered medications for this visit.      Allergies  Allergen Reactions  . Tape Other (See Comments)    Blisters on skin.   USE PAPERTAPE  :  Family History  Problem Relation Age of Onset  . Heart attack Father   . Heart failure Mother   .  Coronary artery disease Brother     stent/CABG  . Diabetes Brother   . Cancer Maternal Aunt     pancreas cancer  . Cancer Maternal Aunt     breast cancer  :  History   Social History  . Marital Status: Single    Spouse Name: N/A    Number of Children: 0  . Years of Education: N/A   Occupational History  .      retired Visual merchandiser, and Engineering geologist   Social History Main Topics  . Smoking status: Never Smoker   . Smokeless tobacco: Former Neurosurgeon    Quit date: 11/17/1981  . Alcohol Use: No  . Drug Use: No  . Sexually Active: Not on file   Other Topics Concern  . Not on file   Social History Narrative  . No narrative on file  :  REVIEW OF SYSTEM:  The rest of the 14-point review of sytem was negative.   Exam: ECOG 0-1  General:  Mildly obese woman, in no acute distress.  Eyes:  no scleral icterus.  ENT:  There were no oropharyngeal lesions.  Neck was without thyromegaly.  Lymphatics:  Negative cervical, supraclavicular or axillary adenopathy.  Respiratory: lungs were clear bilaterally without wheezing or crackles.  Cardiovascular:  Regular rate and rhythm, S1/S2, without murmur, rub or gallop.  There was no pedal edema.  GI:  abdomen was soft, flat, nontender, nondistended, without organomegaly.  Muscoloskeletal:  no spinal tenderness of palpation of vertebral spine.  Skin exam was without echymosis, petichae.  Neuro exam was nonfocal.  Patient was able to get on and off exam table without assistance.  Gait was normal.  Patient was alerted and oriented.  Attention was good.   Language was appropriate.  Mood was normal without depression.  Speech was not pressured.  Thought content was not tangential.    LABS:  Lab Results  Component Value Date   WBC 11.0* 12/27/2012   HGB 11.4* 12/27/2012   HCT 34.3* 12/27/2012   PLT 165 12/27/2012   GLUCOSE 125* 09/22/2011   NA 143 09/22/2011   K 3.4* 09/22/2011   CL 106 09/22/2011   CREATININE 1.0 09/22/2011   BUN 15 09/22/2011   CO2 28  09/22/2011    Blood smear review:   I personally reviewed the patient's peripheral blood smear today.  There was isocytosis.  There was no peripheral blast.  There was no schistocytosis, spherocytosis,  target cell, rouleaux formation, tear drop cell.  There was rare giant platelets but no platelet clumps.      ASSESSMENT AND PLAN:   1.  Elevated white blood cell and neutrophil. - Most likely cause:  Recent bronchitis and use of Advair (which contains steroid).  Steroids tend to increase white blood cell count.  My review of her blood smear today did not show obvious sign of leukemia or bone marrow issue. -Recommendation:  *  Watchful observation.  *  Repeat blood count in about 3, 6, and 9 months.  *  Return visit in the 1 year.    *  In the future, if her white blood cell significantly increases or also with severe anemia and low platelet count, we may consider diagnostic bone marrow biopsy.   - Ms. Tejada and her sister expressed informed understanding and wished to be on watchful observation.  2.  Slight anemia:  Most likely anemia of chronic inflammation (chronic bronchitis).  Last ferritin on 10/25/12 was normal ruling out severe iron deficiency.  She is up to date with colon cancer screening.    3.  Follow up:  Lab only appointment in about 3, 6, and 9 months.  Return visit in about 1 year.      The length of time of the face-to-face encounter was 30 minutes. More than 50% of time was spent counseling and coordination of care.     Thank you for this referral.

## 2012-12-27 NOTE — Patient Instructions (Addendum)
1.  Issue:  Elevated white blood cell and neutrophil. 2.  Most likely cause:  Recent bronchitis and use of Advair (which contains steroid).  Steroids tend to increase white blood cell count.  My review of your blood smear today did not show obvious sign of leukemia or bone marrow issue. 3.  Recommendation:  *  Watchful observation.  *  Repeat blood count in about 3, 6, and 9 months.  *  Return visit in the 1 year.    *  In the future, if your white blood cell significantly increases or also with severe anemia and low platelet count, we may consider diagnostic bone marrow biopsy.

## 2012-12-27 NOTE — Progress Notes (Signed)
Checked in new pt with no financial concerns. °

## 2013-01-25 ENCOUNTER — Telehealth: Payer: Self-pay | Admitting: Internal Medicine

## 2013-01-25 NOTE — Telephone Encounter (Signed)
Pt ID # [id number] A.  [facility] Billing at [phone]  Spoke with Fleet Contras, she has informed me that all forms have been received for patients reimbursement check for Xopenex to be mailed. Called [facility] billing at above number --> Opt 1 (per Fleet Contras)  Spoke with representative, she states the only date of service she sees is 12/23/12 and it shows that they billed Medicaid $30.70 and the co pay on this $7.69. [facility] Billing office closes at 430pm. So Cleda Mccreedy will need a return call in the AM to get a better idea of when she picked up medication and how much she spent to receive this reimbursement check.

## 2013-01-27 NOTE — Telephone Encounter (Signed)
Pt sister advised to contact pharmacy. Carron Curie, CMA

## 2013-01-27 NOTE — Telephone Encounter (Signed)
I spoke with pharmacists at [facility] on [address] and they state that the pt will need to bring receipts back to the pharmacy that they filled the meds at and they will process this. We do not need to contact medicare. I have LMTCB to advise the family. Carron Curie, CMA

## 2013-02-07 ENCOUNTER — Telehealth: Payer: Self-pay | Admitting: Internal Medicine

## 2013-02-07 NOTE — Telephone Encounter (Signed)
Spoke with patients sister-aware that I did not call her ; the only message recently on patient was handled by Carron Curie, CMA on Thursday 01-27-13.

## 2013-02-23 ENCOUNTER — Telehealth: Payer: Self-pay | Admitting: Oncology

## 2013-02-28 ENCOUNTER — Encounter: Payer: Self-pay | Admitting: Oncology

## 2013-02-28 ENCOUNTER — Other Ambulatory Visit: Payer: Medicare Other | Admitting: Lab

## 2013-02-28 ENCOUNTER — Other Ambulatory Visit (HOSPITAL_BASED_OUTPATIENT_CLINIC_OR_DEPARTMENT_OTHER): Payer: Medicare Other

## 2013-02-28 DIAGNOSIS — D72829 Elevated white blood cell count, unspecified: Secondary | ICD-10-CM

## 2013-02-28 DIAGNOSIS — R6889 Other general symptoms and signs: Secondary | ICD-10-CM

## 2013-02-28 LAB — CBC WITH DIFFERENTIAL/PLATELET
BASO%: 0.6 % (ref 0.0–2.0)
EOS%: 3.3 % (ref 0.0–7.0)
HCT: 33.9 % — ABNORMAL LOW (ref 34.8–46.6)
LYMPH%: 27.1 % (ref 14.0–49.7)
MCH: 28.5 pg (ref 25.1–34.0)
MCHC: 33.1 g/dL (ref 31.5–36.0)
MONO%: 6.4 % (ref 0.0–14.0)
NEUT%: 62.6 % (ref 38.4–76.8)
Platelets: 170 10*3/uL (ref 145–400)

## 2013-03-25 ENCOUNTER — Other Ambulatory Visit: Payer: Medicare Other

## 2013-03-28 ENCOUNTER — Other Ambulatory Visit: Payer: Medicare Other | Admitting: Lab

## 2013-06-24 ENCOUNTER — Other Ambulatory Visit: Payer: Medicare Other

## 2013-06-29 ENCOUNTER — Ambulatory Visit: Payer: Medicare Other | Admitting: Internal Medicine

## 2013-09-22 ENCOUNTER — Other Ambulatory Visit: Payer: Self-pay | Admitting: Hematology and Oncology

## 2013-09-22 ENCOUNTER — Encounter: Payer: Self-pay | Admitting: Hematology and Oncology

## 2013-09-22 ENCOUNTER — Telehealth: Payer: Self-pay | Admitting: Hematology and Oncology

## 2013-09-22 DIAGNOSIS — D649 Anemia, unspecified: Secondary | ICD-10-CM

## 2013-09-22 DIAGNOSIS — D72828 Other elevated white blood cell count: Secondary | ICD-10-CM

## 2013-09-22 HISTORY — DX: Anemia, unspecified: D64.9

## 2013-09-22 NOTE — Telephone Encounter (Signed)
Talked to pt, she is out of town right now and was not aware of alab appt, she requested lab and MD be r/s to next week, left a note with Dr..Gorsuch

## 2013-09-23 ENCOUNTER — Ambulatory Visit: Payer: Medicare Other | Admitting: Hematology and Oncology

## 2013-09-23 ENCOUNTER — Other Ambulatory Visit: Payer: Medicare Other | Admitting: Lab

## 2013-09-27 ENCOUNTER — Other Ambulatory Visit: Payer: Medicare Other | Admitting: Lab

## 2013-09-27 ENCOUNTER — Ambulatory Visit: Payer: Medicare Other | Admitting: Hematology and Oncology

## 2013-09-27 ENCOUNTER — Telehealth: Payer: Self-pay | Admitting: Hematology and Oncology

## 2013-09-27 NOTE — Telephone Encounter (Signed)
pts sister left vmm to cancel 11/11 appts bc pts PCP told her blood ok.  Called back to see if wanted to RS and sister said pt did not wish to rs at this time.  Email to NG  to advise of same...shh

## 2013-12-06 ENCOUNTER — Ambulatory Visit (INDEPENDENT_AMBULATORY_CARE_PROVIDER_SITE_OTHER): Payer: Medicare Other | Admitting: Internal Medicine

## 2013-12-06 ENCOUNTER — Encounter: Payer: Self-pay | Admitting: Internal Medicine

## 2013-12-06 VITALS — BP 110/70 | HR 90 | Ht 62.0 in | Wt 194.0 lb

## 2013-12-06 DIAGNOSIS — F411 Generalized anxiety disorder: Secondary | ICD-10-CM

## 2013-12-06 DIAGNOSIS — J45909 Unspecified asthma, uncomplicated: Secondary | ICD-10-CM

## 2013-12-06 DIAGNOSIS — J45998 Other asthma: Secondary | ICD-10-CM

## 2013-12-06 NOTE — Patient Instructions (Signed)
I am glad you are doing so very well. Dr Juleen ChinaKohut will be able to refill your medicines and I will be happy to see you again if needed.

## 2013-12-06 NOTE — Progress Notes (Signed)
Patient ID: Renee [name], female    DOB: 06-15-1941, 73 y.o.   MRN: [id number]   03/18/11- [age] yoF with asthma, hx of DVT and significant anxiety. Last seen 02/26/11-Sister here. I was called by a Hospitalist in [place] recently when she was admitted. He felt the problem was mainly anxiety, but treated her for possible asthma. CT was reported negative for PE and tests were negative for any acute heart . Today- Sister here again. "The shortness left a week ago" and she hasn't needed her breathing meds at all since then. She doesn't know what made the difference. Still notes easy DOE making beds and walking to mailbox.   09/22/11- 69 yoF with asthma, hx of DVT and significant anxiety. Complains of persistent dyspnea with exertion since last here. Little cough or wheeze. Sometimes her rescue inhaler seems to help, and has used her nebulizer once. She notes that several family members have had heart disease.  For the past week she's had increased sinus congestion with "scabs" from her nose. Sometimes feels heart pound. After Spiriva she is able to cough out some dark green sputum. Occasionally wakes smothered. Legs swell, more some days than others, but not bad compared with past history. She gets cramps in her calves and hands. Denies chest pain or choking. Echocardiogram in 2005 indicated ejection fraction 55-65%. CT chest 01/25/2011 at [facility] was negative for PE, clear lung fields except for a 4 mm right lung nodule, benign.  10/31/11- 69 yoF with asthma, chronic dyspnea,  hx of DVT and significant anxiety Sister here. Has had flu vaccine Still complains of shortness of breath as she has ever since spring-easy shortness of breath with exertion but also at rest. Little change from day to day. Occasionally wakes feeling strangled or wheeze he and his feet are swelling more. Uses a diuretic when she thinks she needs to. Watery rhinorrhea. Denies chest pain, palpitation, productive cough or  discolored sputum, fever or swollen glands, bleeding. Sr. reminds me that patient was hospitalized elsewhere in the spring with workup including CT scan but no findings to explain her complaints of dyspnea which they attributed to anxiety. D-dimer 0.73 BMET- ok  CBC- Hgb 10.7 BNP- 29 6 Minute Walk Test 10/31/11- normal oxygenation, starting at 99%, ending at 100% with recovery 2 minutes later 100%, walk distance 408 m.  06/29/12- 70 yoF with asthma, chronic dyspnea,  hx of DVT and significant anxiety States doing better. Denies sob, cough, chest pain, chest tightness, and wheezing.  We reviewed her boy functions from last December. She has stayed in some with heat and humidity but has been able to keep her garden this year. She is off of several of her medications and very pleased with her status. Still using Spiriva each morning with occasional use of a rescue inhaler and uses Advair once daily. PFT: 10/31/2011 normal spirometry scores, FEV1/FVC oh 0.85 with insignificant response to bronchodilator. Diffusion moderately reduced 0.63  12/14/12- 71 yoF with asthma, chronic dyspnea,  hx of DVT and significant anxiety FOLLOWS FOR: has had to go to UC back home 2 times since seeing us last(started with sinus pressure, fluid in ears, and red throat-give Guaiatussin AC Syrup , Tessalon pearls, and Zpak on  12-12-12) Has Bronchitis. Increased SOB, wheezing, cough (mainly at night). Also, told to go back to using  Xopenex 0.63mg  (needs Refill as her Rx has expired) Now on Z-Pak. Being evaluated for anemia. Albuterol causes too much tremor.  12/06/13- 71 yoF  with asthma, chronic dyspnea,  hx of DVT and significant anxiety FOLLOWS FOR: Breathing is unchanged. Reports DOE and coughing. Denies chest tightness or wheezing at this time. Whether change bothers her, stays indoors in winter. Occasional cough. Strong odors/perfumes tight in chest. Uses rescue inhaler if short of breath walking, daily Advair and  Spiriva, occasional Tessalon. Her primary physician has been refilling her meds.  Review of Systems -See HPI Constitutional:   No-   weight loss, night sweats, fevers, chills, fatigue, lassitude. HEENT:   No-  headaches, difficulty swallowing, tooth/dental problems, sore throat,       No-  sneezing, itching, ear ache, nasal congestion, post nasal drip,  CV:  No-   chest pain, orthopnea, PND, +swelling in lower extremities, No- anasarca, dizziness, palpitations Resp: + chronic shortness of breath with exertion and at rest.              No- productive cough,  + non-productive cough,  No- coughing up of blood.              No- sustained  change in color of mucus.  + wheezing.   Skin: No-   rash or lesions. GI:  No-   heartburn, indigestion, abdominal pain, nausea, vomiting,  GU: . MS:  No-   joint pain or swelling.   Neuro-     nothing unusual Psych:  No- change in mood or affect. No depression or anxiety.  No memory loss.   Objective:   Physical Exam General- Alert, Oriented, Affect-appropriate, Distress- none acute; obese Skin- rash-none, lesions- none, excoriation- none Lymphadenopathy- none Head- atraumatic            Eyes- Gross vision intact, PERRLA, conjunctivae clear secretions            Ears- Hearing, canals-normal            Nose- Clear, no-Septal dev, mucus, polyps, erosion, perforation             Throat- Mallampati II , mucosa -clear , drainage- none, tonsils- atrophic. Dentures Neck- flexible , trachea midline, no stridor , thyroid nl, carotid no bruit Chest - symmetrical excursion , unlabored           Heart/CV- RRR , no murmur , no gallop  , no rub, nl s1 s2                           - JVD- 1 cm , edema- none, stasis changes- none, varices- none           Lung- clear, wheeze- none, hoarse cough , dullness-none, rub- none.           Chest wall-  Abd-  Br/ Gen/ Rectal- Not done, not indicated Extrem- cyanosis- none, clubbing, none, atrophy- none, strength- nl; old TKR  knee scars, soft varices right calf,  Neuro- grossly intact to observation

## 2013-12-23 ENCOUNTER — Ambulatory Visit: Payer: Medicare Other | Admitting: Oncology

## 2013-12-23 ENCOUNTER — Other Ambulatory Visit: Payer: Medicare Other | Admitting: Lab

## 2014-01-03 NOTE — Assessment & Plan Note (Signed)
Much more emotionally stable in recent years.

## 2014-01-03 NOTE — Assessment & Plan Note (Signed)
Well controlled, with symptoms associated with exertion, strong odors and weather changes. No acute events and her primary physician fills her meds. I suggested that we see her back here if needed.

## 2014-04-24 ENCOUNTER — Ambulatory Visit (INDEPENDENT_AMBULATORY_CARE_PROVIDER_SITE_OTHER): Payer: Self-pay

## 2014-04-24 ENCOUNTER — Ambulatory Visit (INDEPENDENT_AMBULATORY_CARE_PROVIDER_SITE_OTHER): Payer: Medicare Other | Admitting: Neurology

## 2014-04-24 DIAGNOSIS — G5620 Lesion of ulnar nerve, unspecified upper limb: Secondary | ICD-10-CM

## 2014-04-24 DIAGNOSIS — Z0289 Encounter for other administrative examinations: Secondary | ICD-10-CM

## 2014-04-24 NOTE — Procedures (Signed)
     HISTORY:  Renee [name] is a 73 year old patient with a 4 month history of numbness of the entire hand on the left. The patient does have some left-sided neck pain without radiation down the arm. She indicates that there is some weakness with grip with the left hand. She is being evaluated for a possible neuropathy or a cervical radiculopathy.  NERVE CONDUCTION STUDIES:  Nerve conduction studies were performed on both upper extremities. The distal motor latencies and motor amplitudes for the median and ulnar nerves were within normal limits, with the exception that the left ulnar nerve reveals a prolonged distal motor latency and a low motor amplitude. The F wave latencies and nerve conduction velocities for these nerves were also normal. The sensory latencies for the median and ulnar nerves were normal.   EMG STUDIES:  EMG study was performed on the left upper extremity:  The first dorsal interosseous muscle reveals 2 to 5 K units with decreased recruitment. 2+ fibrillations and positive waves were noted. The abductor pollicis brevis muscle reveals 2 to 4 K units with full recruitment. No fibrillations or positive waves were noted. The extensor indicis proprius muscle reveals 1 to 3 K units with full recruitment. No fibrillations or positive waves were noted. The pronator teres muscle reveals 2 to 3 K units with full recruitment. No fibrillations or positive waves were noted. The biceps muscle reveals 1 to 2 K units with full recruitment. No fibrillations or positive waves were noted. The triceps muscle reveals 2 to 4 K units with full recruitment. No fibrillations or positive waves were noted. The anterior deltoid muscle reveals 2 to 3 K units with full recruitment. No fibrillations or positive waves were noted. The cervical paraspinal muscles were tested at 2 levels. No abnormalities of insertional activity were seen at either level tested. There was good  relaxation.   IMPRESSION:  Nerve conduction studies done on both upper extremities is unremarkable with exception of some evidence of distal dysfunction of the left ulnar nerve. 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  Marlan Palau MD 04/24/2014 11:32 AM  [facility] [address] [place], [place] [address]  Phone [phone] Fax [phone]

## 2014-10-04 ENCOUNTER — Encounter: Payer: Self-pay | Admitting: Neurology

## 2014-10-10 ENCOUNTER — Encounter: Payer: Self-pay | Admitting: Neurology

## 2015-02-28 DIAGNOSIS — R0689 Other abnormalities of breathing: Secondary | ICD-10-CM | POA: Diagnosis not present

## 2015-02-28 DIAGNOSIS — J42 Unspecified chronic bronchitis: Secondary | ICD-10-CM | POA: Diagnosis not present

## 2015-02-28 DIAGNOSIS — F419 Anxiety disorder, unspecified: Secondary | ICD-10-CM | POA: Diagnosis not present

## 2015-02-28 DIAGNOSIS — E789 Disorder of lipoprotein metabolism, unspecified: Secondary | ICD-10-CM | POA: Diagnosis not present

## 2015-03-29 ENCOUNTER — Other Ambulatory Visit: Payer: Self-pay | Admitting: Obstetrics and Gynecology

## 2015-03-29 DIAGNOSIS — Z6838 Body mass index (BMI) 38.0-38.9, adult: Secondary | ICD-10-CM | POA: Diagnosis not present

## 2015-03-29 DIAGNOSIS — N958 Other specified menopausal and perimenopausal disorders: Secondary | ICD-10-CM | POA: Diagnosis not present

## 2015-03-29 DIAGNOSIS — Z124 Encounter for screening for malignant neoplasm of cervix: Secondary | ICD-10-CM | POA: Diagnosis not present

## 2015-03-29 DIAGNOSIS — M8588 Other specified disorders of bone density and structure, other site: Secondary | ICD-10-CM | POA: Diagnosis not present

## 2015-03-29 DIAGNOSIS — Z1231 Encounter for screening mammogram for malignant neoplasm of breast: Secondary | ICD-10-CM | POA: Diagnosis not present

## 2015-03-30 LAB — CYTOLOGY - PAP

## 2015-05-30 DIAGNOSIS — M549 Dorsalgia, unspecified: Secondary | ICD-10-CM | POA: Diagnosis not present

## 2015-05-30 DIAGNOSIS — R0689 Other abnormalities of breathing: Secondary | ICD-10-CM | POA: Diagnosis not present

## 2015-05-30 DIAGNOSIS — M1990 Unspecified osteoarthritis, unspecified site: Secondary | ICD-10-CM | POA: Diagnosis not present

## 2015-07-25 DIAGNOSIS — Z79891 Long term (current) use of opiate analgesic: Secondary | ICD-10-CM | POA: Diagnosis not present

## 2015-07-25 DIAGNOSIS — I10 Essential (primary) hypertension: Secondary | ICD-10-CM | POA: Diagnosis not present

## 2015-07-25 DIAGNOSIS — N390 Urinary tract infection, site not specified: Secondary | ICD-10-CM | POA: Diagnosis not present

## 2015-07-25 DIAGNOSIS — E119 Type 2 diabetes mellitus without complications: Secondary | ICD-10-CM | POA: Diagnosis not present

## 2015-07-25 DIAGNOSIS — D649 Anemia, unspecified: Secondary | ICD-10-CM | POA: Diagnosis not present

## 2015-08-01 DIAGNOSIS — M549 Dorsalgia, unspecified: Secondary | ICD-10-CM | POA: Diagnosis not present

## 2015-08-01 DIAGNOSIS — R0689 Other abnormalities of breathing: Secondary | ICD-10-CM | POA: Diagnosis not present

## 2015-12-04 DIAGNOSIS — R05 Cough: Secondary | ICD-10-CM | POA: Diagnosis not present

## 2015-12-04 DIAGNOSIS — J209 Acute bronchitis, unspecified: Secondary | ICD-10-CM | POA: Diagnosis not present

## 2016-01-29 DIAGNOSIS — R0689 Other abnormalities of breathing: Secondary | ICD-10-CM | POA: Diagnosis not present

## 2016-01-29 DIAGNOSIS — E789 Disorder of lipoprotein metabolism, unspecified: Secondary | ICD-10-CM | POA: Diagnosis not present

## 2016-01-29 DIAGNOSIS — J42 Unspecified chronic bronchitis: Secondary | ICD-10-CM | POA: Diagnosis not present

## 2016-03-18 DIAGNOSIS — F419 Anxiety disorder, unspecified: Secondary | ICD-10-CM | POA: Diagnosis not present

## 2016-03-18 DIAGNOSIS — R0602 Shortness of breath: Secondary | ICD-10-CM | POA: Diagnosis not present

## 2016-03-18 DIAGNOSIS — M549 Dorsalgia, unspecified: Secondary | ICD-10-CM | POA: Diagnosis not present

## 2016-04-22 DIAGNOSIS — S93609A Unspecified sprain of unspecified foot, initial encounter: Secondary | ICD-10-CM | POA: Diagnosis not present

## 2016-04-22 DIAGNOSIS — M79672 Pain in left foot: Secondary | ICD-10-CM | POA: Diagnosis not present

## 2016-04-22 DIAGNOSIS — M79671 Pain in right foot: Secondary | ICD-10-CM | POA: Diagnosis not present

## 2016-04-22 DIAGNOSIS — T1490 Injury, unspecified: Secondary | ICD-10-CM | POA: Diagnosis not present

## 2016-05-07 DIAGNOSIS — R21 Rash and other nonspecific skin eruption: Secondary | ICD-10-CM | POA: Diagnosis not present

## 2016-06-23 DIAGNOSIS — L309 Dermatitis, unspecified: Secondary | ICD-10-CM | POA: Diagnosis not present

## 2016-07-30 DIAGNOSIS — E789 Disorder of lipoprotein metabolism, unspecified: Secondary | ICD-10-CM | POA: Diagnosis not present

## 2016-07-30 DIAGNOSIS — D649 Anemia, unspecified: Secondary | ICD-10-CM | POA: Diagnosis not present

## 2016-07-30 DIAGNOSIS — Z0000 Encounter for general adult medical examination without abnormal findings: Secondary | ICD-10-CM | POA: Diagnosis not present

## 2016-07-30 DIAGNOSIS — N390 Urinary tract infection, site not specified: Secondary | ICD-10-CM | POA: Diagnosis not present

## 2016-07-30 DIAGNOSIS — I10 Essential (primary) hypertension: Secondary | ICD-10-CM | POA: Diagnosis not present

## 2016-07-30 DIAGNOSIS — E119 Type 2 diabetes mellitus without complications: Secondary | ICD-10-CM | POA: Diagnosis not present

## 2016-07-30 DIAGNOSIS — Z23 Encounter for immunization: Secondary | ICD-10-CM | POA: Diagnosis not present

## 2016-08-05 DIAGNOSIS — K219 Gastro-esophageal reflux disease without esophagitis: Secondary | ICD-10-CM | POA: Diagnosis not present

## 2016-08-05 DIAGNOSIS — E789 Disorder of lipoprotein metabolism, unspecified: Secondary | ICD-10-CM | POA: Diagnosis not present

## 2016-08-05 DIAGNOSIS — R0689 Other abnormalities of breathing: Secondary | ICD-10-CM | POA: Diagnosis not present

## 2016-08-05 DIAGNOSIS — N390 Urinary tract infection, site not specified: Secondary | ICD-10-CM | POA: Diagnosis not present

## 2016-09-17 DIAGNOSIS — R300 Dysuria: Secondary | ICD-10-CM | POA: Diagnosis not present

## 2016-09-17 DIAGNOSIS — Z1231 Encounter for screening mammogram for malignant neoplasm of breast: Secondary | ICD-10-CM | POA: Diagnosis not present

## 2016-09-17 DIAGNOSIS — Z01419 Encounter for gynecological examination (general) (routine) without abnormal findings: Secondary | ICD-10-CM | POA: Diagnosis not present

## 2016-09-17 DIAGNOSIS — Z836 Family history of other diseases of the respiratory system: Secondary | ICD-10-CM | POA: Diagnosis not present

## 2016-11-04 DIAGNOSIS — I10 Essential (primary) hypertension: Secondary | ICD-10-CM | POA: Diagnosis not present

## 2016-11-04 DIAGNOSIS — E789 Disorder of lipoprotein metabolism, unspecified: Secondary | ICD-10-CM | POA: Diagnosis not present

## 2016-11-04 DIAGNOSIS — N3281 Overactive bladder: Secondary | ICD-10-CM | POA: Diagnosis not present

## 2016-11-04 DIAGNOSIS — E119 Type 2 diabetes mellitus without complications: Secondary | ICD-10-CM | POA: Diagnosis not present

## 2017-02-12 DIAGNOSIS — R0602 Shortness of breath: Secondary | ICD-10-CM | POA: Diagnosis not present

## 2017-02-12 DIAGNOSIS — N3281 Overactive bladder: Secondary | ICD-10-CM | POA: Diagnosis not present

## 2017-02-18 DIAGNOSIS — M25552 Pain in left hip: Secondary | ICD-10-CM | POA: Diagnosis not present

## 2017-03-06 DIAGNOSIS — M25561 Pain in right knee: Secondary | ICD-10-CM | POA: Diagnosis not present

## 2017-03-06 DIAGNOSIS — M25562 Pain in left knee: Secondary | ICD-10-CM | POA: Diagnosis not present

## 2017-03-06 DIAGNOSIS — M170 Bilateral primary osteoarthritis of knee: Secondary | ICD-10-CM | POA: Diagnosis not present

## 2017-03-17 DIAGNOSIS — Z471 Aftercare following joint replacement surgery: Secondary | ICD-10-CM | POA: Diagnosis not present

## 2017-03-17 DIAGNOSIS — M170 Bilateral primary osteoarthritis of knee: Secondary | ICD-10-CM | POA: Diagnosis not present

## 2017-03-17 DIAGNOSIS — Z96653 Presence of artificial knee joint, bilateral: Secondary | ICD-10-CM | POA: Diagnosis not present

## 2017-03-20 DIAGNOSIS — Z96651 Presence of right artificial knee joint: Secondary | ICD-10-CM | POA: Diagnosis not present

## 2017-03-20 DIAGNOSIS — M25561 Pain in right knee: Secondary | ICD-10-CM | POA: Diagnosis not present

## 2017-03-20 DIAGNOSIS — T8484XD Pain due to internal orthopedic prosthetic devices, implants and grafts, subsequent encounter: Secondary | ICD-10-CM | POA: Diagnosis not present

## 2017-03-23 DIAGNOSIS — M79609 Pain in unspecified limb: Secondary | ICD-10-CM | POA: Diagnosis not present

## 2017-03-23 DIAGNOSIS — M25561 Pain in right knee: Secondary | ICD-10-CM | POA: Diagnosis not present

## 2017-03-23 DIAGNOSIS — Z96651 Presence of right artificial knee joint: Secondary | ICD-10-CM | POA: Diagnosis not present

## 2017-03-23 DIAGNOSIS — M25461 Effusion, right knee: Secondary | ICD-10-CM | POA: Diagnosis not present

## 2017-03-23 DIAGNOSIS — T8484XD Pain due to internal orthopedic prosthetic devices, implants and grafts, subsequent encounter: Secondary | ICD-10-CM | POA: Diagnosis not present

## 2017-04-09 DIAGNOSIS — R0602 Shortness of breath: Secondary | ICD-10-CM | POA: Diagnosis not present

## 2017-04-09 DIAGNOSIS — N3281 Overactive bladder: Secondary | ICD-10-CM | POA: Diagnosis not present

## 2017-04-09 DIAGNOSIS — M25569 Pain in unspecified knee: Secondary | ICD-10-CM | POA: Diagnosis not present

## 2017-04-14 ENCOUNTER — Telehealth: Payer: Self-pay | Admitting: Physician Assistant

## 2017-04-14 NOTE — Telephone Encounter (Signed)
Received records from [facility] for appointment on 04/24/17 with Azalee CourseHao Meng, P.A.  Records put with Hao's schedule for 04/24/17. lp

## 2017-04-24 ENCOUNTER — Ambulatory Visit (INDEPENDENT_AMBULATORY_CARE_PROVIDER_SITE_OTHER): Payer: Medicare Other | Admitting: Physician Assistant

## 2017-04-24 ENCOUNTER — Encounter: Payer: Self-pay | Admitting: Physician Assistant

## 2017-04-24 VITALS — BP 122/68 | HR 92 | Ht 62.0 in | Wt 198.0 lb

## 2017-04-24 DIAGNOSIS — R011 Cardiac murmur, unspecified: Secondary | ICD-10-CM

## 2017-04-24 DIAGNOSIS — E785 Hyperlipidemia, unspecified: Secondary | ICD-10-CM | POA: Diagnosis not present

## 2017-04-24 DIAGNOSIS — R0609 Other forms of dyspnea: Secondary | ICD-10-CM

## 2017-04-24 DIAGNOSIS — Z01810 Encounter for preprocedural cardiovascular examination: Secondary | ICD-10-CM

## 2017-04-24 DIAGNOSIS — I10 Essential (primary) hypertension: Secondary | ICD-10-CM

## 2017-04-24 NOTE — Progress Notes (Signed)
Cardiology Office Note    Date:  04/25/2017   ID:  Renee [name], DOB Feb 04, 1941, MRN [id number]  PCP:  Darci NeedleKohut, Walter, MD  Cardiologist:  New - case discussed with Dr. Herbie BaltimoreHarding, DOD  Chief Complaint  Patient presents with  . New Patient (Initial Visit)    surgical clearance-pcp wants pt to have cath but patient wants echo   Patient referred to cardiology service at the request of her orthopedic surgeon Dr. Vilinda BoehringerWard Oakley Jr at [facility] for preoperative clearance prior to revision of right total knee replacement  History of Present Illness:  Renee [name] is a 76 y.o. female with PMH of HTN, HLD, h/o DVT, asthma, and h/o anemia. According to the patient, she has had at least 4 cardiac catheterization done in the past. Three of which was done in [facility], 1 cardiac catheterization was done at [facility] Last cardiac catheterization was done on 10/19/2000 which showed normal coronary arteries. Her last echocardiogram was obtained on 12/06/2003 which showed EF 55-65%, mildly calcified aortic valve. She has not been seen by cardiologist in over 10 years. She was referred today by [facility] orthopedic surgeon Dr. Vilinda BoehringerWard Oakley Jr at [facility] for preoperative clearance prior to revision of right total knee replacement. She walks around with a walker. She does have baseline chronic dyspnea on exertion, she said this has not changed for the past 30-40 years. She denies any significant chest discomfort with walking. Otherwise she does not do much strenuous activity. On physical exam, she does have a murmur at the apex location. Given no obvious ischemic changes on the EKG, we do not recommend any stress testing in this case. I would however recommend a baseline echocardiogram now for the stable chronic dyspnea on exertion but more for the cardiac murmur noted on physical exam. If echocardiogram is normal, she will be cleared for surgery as low risk patient. It is unclear  to me what is causing her dyspnea on exertion, she says this has been going on for several decades. She does have a history of DVT (in 2012?), she does not remember this, and does not remember if she has ever been on Coumadin. She is currently on aspirin only.    Past Medical History:  Diagnosis Date  . Acute bronchitis   . Anemia, unspecified 09/22/2013  . Colonic polyp   . DVT (deep venous thrombosis) (HCC)    Right leg. x 1.  off of Coumadin now.   . Generalized anxiety disorder   . Granulocytosis 12/14/2012  . Hyperlipidemia   . Hypertension   . Shortness of breath     Past Surgical History:  Procedure Laterality Date  . BREAST BIOPSY     left  . CHOLECYSTECTOMY    . COLONOSCOPY  2011   next colonscopy in 2015.   Marland Kitchen. FOOT SURGERY    . HIATAL HERNIA REPAIR     x 2   . PARTIAL NEPHRECTOMY Left 2009   kidney cancer.     Current Medications: Outpatient Medications Prior to Visit  Medication Sig Dispense Refill  . albuterol (PROAIR HFA) 108 (90 BASE) MCG/ACT inhaler Inhale 2 puffs into the lungs 4 (four) times daily.      Marland Kitchen. aspirin 81 MG tablet Take 81 mg by mouth daily.      Marland Kitchen. b complex vitamins tablet Take 1 tablet by mouth daily.    . Cholecalciferol (VITAMIN D3) 1000 UNITS CAPS Take 1 capsule by mouth  daily.      . Fluticasone-Salmeterol (ADVAIR) 250-50 MCG/DOSE AEPB Inhale 1 puff into the lungs daily.    . furosemide (LASIX) 40 MG tablet Take 40 mg by mouth daily.      Marland Kitchen HYDROcodone-acetaminophen (VICODIN) 5-500 MG per tablet Take 1 tablet by mouth every 6 (six) hours as needed.      . levalbuterol (XOPENEX) 0.63 MG/3ML nebulizer solution Take 3 mLs (0.63 mg total) by nebulization every 6 (six) hours as needed. DX: 493.10,  File under MCR PART B 150 mL 6  . omeprazole (PRILOSEC) 20 MG capsule Take 20 mg by mouth as needed.     Marland Kitchen PARoxetine (PAXIL-CR) 37.5 MG 24 hr tablet Take 37.5 mg by mouth every morning.    . potassium chloride (K-DUR,KLOR-CON) 10 MEQ tablet Take 10  mEq by mouth 2 (two) times daily.    . rosuvastatin (CRESTOR) 10 MG tablet Take 10 mg by mouth daily.      Marland Kitchen tiotropium (SPIRIVA) 18 MCG inhalation capsule Place 18 mcg into inhaler and inhale daily.      . vitamin E 1000 UNIT capsule Take 1,000 Units by mouth daily.    . benzonatate (TESSALON) 100 MG capsule Take 100 mg by mouth 3 (three) times daily as needed for cough.    . ferrous sulfate 325 (65 FE) MG EC tablet Take 325 mg by mouth daily with breakfast.    . mirabegron ER (MYRBETRIQ) 50 MG TB24 tablet Take 50 mg by mouth daily.     No facility-administered medications prior to visit.      Allergies:   Tape   Social History   Social History  . Marital status: Single    Spouse name: N/A  . Number of children: 0  . Years of education: N/A   Occupational History  .      retired Visual merchandiser, and Engineering geologist   Social History Main Topics  . Smoking status: Former Smoker    Types: Cigars, Cigarettes  . Smokeless tobacco: Former Neurosurgeon    Quit date: 11/17/1981  . Alcohol use No  . Drug use: No  . Sexual activity: Not Asked   Other Topics Concern  . None   Social History Narrative  . None     Family History:  The patient's family history includes Cancer in her maternal aunt and maternal aunt; Coronary artery disease in her brother; Diabetes in her brother; Heart attack in her father; Heart failure in her mother.   ROS:   Please see the history of present illness.    ROS All other systems reviewed and are negative.   PHYSICAL EXAM:   VS:  BP 122/68   Pulse 92   Ht 5\' 2"  (1.575 m)   Wt 198 lb (89.8 kg)   BMI 36.21 kg/m    GEN: Well nourished, well developed, in no acute distress  HEENT: normal  Neck: no JVD, carotid bruits, or masses Cardiac: RRR; no rubs, or gallops,no edema.  2/6 systolic murmur at apex Respiratory:  clear to auscultation bilaterally, normal work of breathing GI: soft, nontender, nondistended, + BS MS: no deformity or atrophy  Skin: warm and dry, no  rash Neuro:  Alert and Oriented x 3, Strength and sensation are intact Psych: euthymic mood, full affect  Wt Readings from Last 3 Encounters:  04/24/17 198 lb (89.8 kg)  12/06/13 194 lb (88 kg)  12/27/12 196 lb 6.4 oz (89.1 kg)      Studies/Labs Reviewed:  EKG:  EKG is ordered today.  The ekg ordered today demonstrates Normal sinus rhythm without significant ST-T wave changes. Incomplete right bundle branch block.  Recent Labs: No results found for requested labs within last 8760 hours.   Lipid Panel No results found for: CHOL, TRIG, HDL, CHOLHDL, VLDL, LDLCALC, LDLDIRECT  Additional studies/ records that were reviewed today include:   Cath 10/19/2000 HEMODYNAMICS: 1. Aortic systolic pressure 159, diastolic pressure 86. 2. Left ventricular systolic pressure 159, diastolic pressure 20.  SELECTIVE CORONARY ANGIOGRAPHY: 1. Left main:  Normal. 2. LAD:  Normal. 3. Left circumflex:  Normal. 4. Right coronary artery:  Dominant and normal.  LEFT VENTRICULOGRAPHY:  RAO left ventriculogram was performed using 20 cc of Omnipaque dye at 10 cc per second in the RAO view.  The overall LVEF was estimated greater than 60% without focal wall motion abnormalities.   Echo 12/06/2003 SUMMARY - Overall left ventricular systolic function was normal. Left    ventricular ejection fraction was estimated , range being 55    % to 65 %. - The aortic valve was mildly calcified.  ASSESSMENT:    1. Preoperative cardiovascular examination   2. Murmur, cardiac   3. DOE (dyspnea on exertion)   4. Essential hypertension   5. Hyperlipidemia, unspecified hyperlipidemia type      PLAN:  In order of problems listed above:  1. Preoperative clearance prior to right knee surgery: She denies ever having any chest discomfort. She has chronic dyspnea on exertion which has been unchanged for the past 30 years. We plan to obtain echocardiogram, if normal, she is cleared to proceed with  surgery. Unless echocardiogram is severely abnormal, I would not recommend stress testing in this case. The case has been discussed with DOD Dr. Herbie Baltimore who also agrees.  2. Systolic murmur: Located at the apex, unclear cause, has a sharp high pitch sound to it. Does not sound like normal heart murmur. Nor do I think it is pericardial rub given lack of symptoms. Will assess on echocardiogram.  3. Chronic dyspnea on exertion: Unchanged for the past several decades. Although our chart documented that she is a former smoker, patient denies this, she says she was only given some cigarette when she was a child but have never really picked this up herself.  4. Hypertension: Surprisingly, despite hypertension listed as part of past medical history, she is not on any blood pressure medication and her blood pressure is normal today. Question if she truly has hypertension.  5. Hyperlipidemia: She is on low-dose Crestor.    Medication Adjustments/Labs and Tests Ordered: Current medicines are reviewed at length with the patient today.  Concerns regarding medicines are outlined above.  Medication changes, Labs and Tests ordered today are listed in the Patient Instructions below. Patient Instructions  Medication Instructions:   No changes  Labwork:   none  Testing/Procedures:  Your physician has requested that you have an echocardiogram. Echocardiography is a painless test that uses sound waves to create images of your heart. It provides your doctor with information about the size and shape of your heart and how well your heart's chambers and valves are working. This procedure takes approximately one hour. There are no restrictions for this procedure.    Follow-Up:  As needed with cardiology     If you need a refill on your cardiac medications before your next appointment, please call your pharmacy.      Ramond Dial, [place]  04/25/2017 12:18 PM    [facility] [address] [place], [place], [place]  [address] Phone: [phone]; Fax: [phone]

## 2017-04-24 NOTE — Patient Instructions (Signed)
Medication Instructions:   No changes  Labwork:   none  Testing/Procedures:  Your physician has requested that you have an echocardiogram. Echocardiography is a painless test that uses sound waves to create images of your heart. It provides your doctor with information about the size and shape of your heart and how well your heart's chambers and valves are working. This procedure takes approximately one hour. There are no restrictions for this procedure.    Follow-Up:  As needed with cardiology     If you need a refill on your cardiac medications before your next appointment, please call your pharmacy.

## 2017-04-25 ENCOUNTER — Other Ambulatory Visit: Payer: Self-pay | Admitting: Physician Assistant

## 2017-04-25 ENCOUNTER — Encounter: Payer: Self-pay | Admitting: Physician Assistant

## 2017-04-27 ENCOUNTER — Telehealth: Payer: Self-pay | Admitting: *Deleted

## 2017-04-27 NOTE — Telephone Encounter (Signed)
Wynema BirchHao has seen patient for OV on 6/9 and recommended echocardiogram - clearance pending outcome of this test. I have faxed notes back to the requesting office to apprise them that the patient's clearance status is still pending.

## 2017-04-27 NOTE — Telephone Encounter (Signed)
Request for surgical clearance:  1. What type of surgery is being performed? Revision of Right Total Knee Replacement  2. When is this surgery scheduled? TBD  3. Are there any medications that need to be held prior to surgery and how long? ASA 81mg  daily  4. Name of physician performing surgery? Ward Debbe Odeaakley Jr, MD  5. What is your office phone and fax number?   Ph: [phone] Jimmie Molly(Mary H - office contact) fax: [phone]

## 2017-04-30 NOTE — Addendum Note (Signed)
Addended by: Kandice RobinsonsYOUNG, Zaydin Billey T on: 04/30/2017 07:06 AM   Modules accepted: Orders

## 2017-05-01 NOTE — Telephone Encounter (Signed)
Stat echo was requested by provider at last week's visit. Pt is from out of town and staying with sister until echo completed. I brought patient to desk for scheduling following her visit but was not privy to the appt arrangement.  Appears that patient was in fact scheduled for 6/22 - not sure that this is something nursing can address. Will seek review by scheduling - please see if patient can be accomodated for sooner appt.

## 2017-05-01 NOTE — Telephone Encounter (Signed)
Renee [name] (patient sister) calling, states that she was told that patient would have echocardiogram today @1  pm. I informed patient in our system it is scheduled for Friday 05-08-17 @1pm . Ms. [name] states that she was told that patient would have echo today at 1 pm and patient was unable to have it completed because she was told that she was not on the list. Please call to discuss, thanks.

## 2017-05-08 ENCOUNTER — Other Ambulatory Visit: Payer: Self-pay

## 2017-05-08 ENCOUNTER — Ambulatory Visit (HOSPITAL_COMMUNITY): Payer: Medicare Other | Attending: Cardiovascular Disease

## 2017-05-08 ENCOUNTER — Encounter: Payer: Self-pay | Admitting: Physician Assistant

## 2017-05-08 DIAGNOSIS — I071 Rheumatic tricuspid insufficiency: Secondary | ICD-10-CM | POA: Diagnosis not present

## 2017-05-08 DIAGNOSIS — Z87891 Personal history of nicotine dependence: Secondary | ICD-10-CM | POA: Diagnosis not present

## 2017-05-08 DIAGNOSIS — R011 Cardiac murmur, unspecified: Secondary | ICD-10-CM | POA: Insufficient documentation

## 2017-05-08 DIAGNOSIS — I10 Essential (primary) hypertension: Secondary | ICD-10-CM | POA: Insufficient documentation

## 2017-05-08 DIAGNOSIS — Z86718 Personal history of other venous thrombosis and embolism: Secondary | ICD-10-CM | POA: Insufficient documentation

## 2017-05-08 DIAGNOSIS — D649 Anemia, unspecified: Secondary | ICD-10-CM | POA: Diagnosis not present

## 2017-05-08 LAB — ECHOCARDIOGRAM COMPLETE
AVLVOTPG: 6 mmHg
Ao-asc: 34 cm
Area-P 1/2: 3.93 cm2
EERAT: 6.58
EWDT: 190 ms
FS: 24 % — AB (ref 28–44)
IV/PV OW: 1.45
LA ID, A-P, ES: 28 mm
LA vol: 34.7 mL
LADIAMINDEX: 1.47 cm/m2
LAVOLA4C: 36.8 mL
LAVOLIN: 18.3 mL/m2
LEFT ATRIUM END SYS DIAM: 28 mm
LV E/e'average: 6.58
LV PW d: 11 mm — AB (ref 0.6–1.1)
LV e' LATERAL: 9.36 cm/s
LVEEMED: 6.58
LVOT VTI: 27.9 cm
LVOT area: 3.14 cm2
LVOT diameter: 20 mm
LVOT peak vel: 120 cm/s
LVOTSV: 88 mL
Lateral S' vel: 11.1 cm/s
MV Dec: 190
MV pk E vel: 61.6 m/s
MVPKAVEL: 91.7 m/s
MVSPHT: 56 ms
Reg peak vel: 231 cm/s
TAPSE: 21.3 mm
TDI e' lateral: 9.36
TDI e' medial: 6.85
TRMAXVEL: 231 cm/s

## 2017-05-08 MED ORDER — PERFLUTREN LIPID MICROSPHERE
1.0000 mL | INTRAVENOUS | Status: AC | PRN
  Administered 2017-05-08: 1 mL via INTRAVENOUS

## 2017-05-11 ENCOUNTER — Telehealth: Payer: Self-pay | Admitting: Physician Assistant

## 2017-05-11 NOTE — Telephone Encounter (Signed)
°  Follow Up ° ° °Calling to follow up on Echocardiogram results. Please call. °

## 2017-05-12 NOTE — Telephone Encounter (Signed)
Follow up    Pt sister is calling about echo results. Please call.

## 2017-05-12 NOTE — Telephone Encounter (Signed)
-----   Message from HaynesHao Meng, [place]PA sent at 05/08/2017  4:24 PM EDT ----- Echo shows normal pumping function. Moderately thickening of heart wall likely related to chronic hypertension. Otherwise she is cleared to proceed with surgery.

## 2017-05-12 NOTE — Telephone Encounter (Signed)
Lm with pt sister, Cleda MccreedyLula, for pt to call us back.  There is no DPR on file for us to speak with anyone else. She verbalized understanding.

## 2017-05-13 ENCOUNTER — Encounter: Payer: Self-pay | Admitting: Physician Assistant

## 2017-05-13 ENCOUNTER — Telehealth: Payer: Self-pay | Admitting: Physician Assistant

## 2017-05-13 NOTE — Telephone Encounter (Signed)
Patient stated that we can talk to her sister Renee [name] about any medical information. Patient will have a copy of her POA sent to the office to put in her chart. Made note on patient's chart, okay to talk to her sister. Patient verbalized understanding.

## 2017-05-13 NOTE — Telephone Encounter (Signed)
Patient calling and requests to speak with you.

## 2017-06-16 DIAGNOSIS — S8991XD Unspecified injury of right lower leg, subsequent encounter: Secondary | ICD-10-CM | POA: Diagnosis not present

## 2017-06-16 DIAGNOSIS — Z01818 Encounter for other preprocedural examination: Secondary | ICD-10-CM | POA: Diagnosis not present

## 2017-06-16 DIAGNOSIS — M25561 Pain in right knee: Secondary | ICD-10-CM | POA: Diagnosis not present

## 2017-07-08 DIAGNOSIS — M6580 Other synovitis and tenosynovitis, unspecified site: Secondary | ICD-10-CM | POA: Diagnosis not present

## 2017-10-20 DIAGNOSIS — E789 Disorder of lipoprotein metabolism, unspecified: Secondary | ICD-10-CM | POA: Diagnosis not present

## 2017-10-20 DIAGNOSIS — E119 Type 2 diabetes mellitus without complications: Secondary | ICD-10-CM | POA: Diagnosis not present

## 2017-10-20 DIAGNOSIS — I10 Essential (primary) hypertension: Secondary | ICD-10-CM | POA: Diagnosis not present

## 2017-10-20 DIAGNOSIS — G8929 Other chronic pain: Secondary | ICD-10-CM | POA: Diagnosis not present

## 2017-10-20 DIAGNOSIS — N390 Urinary tract infection, site not specified: Secondary | ICD-10-CM | POA: Diagnosis not present

## 2017-10-20 DIAGNOSIS — Z5181 Encounter for therapeutic drug level monitoring: Secondary | ICD-10-CM | POA: Diagnosis not present

## 2018-01-05 DIAGNOSIS — J45901 Unspecified asthma with (acute) exacerbation: Secondary | ICD-10-CM | POA: Diagnosis not present

## 2018-01-05 DIAGNOSIS — R05 Cough: Secondary | ICD-10-CM | POA: Diagnosis not present

## 2018-01-05 DIAGNOSIS — J40 Bronchitis, not specified as acute or chronic: Secondary | ICD-10-CM | POA: Diagnosis not present

## 2018-04-14 DIAGNOSIS — Z79899 Other long term (current) drug therapy: Secondary | ICD-10-CM | POA: Diagnosis not present

## 2018-04-14 DIAGNOSIS — D630 Anemia in neoplastic disease: Secondary | ICD-10-CM | POA: Diagnosis not present

## 2018-04-14 DIAGNOSIS — D649 Anemia, unspecified: Secondary | ICD-10-CM | POA: Diagnosis not present

## 2018-04-21 DIAGNOSIS — D649 Anemia, unspecified: Secondary | ICD-10-CM | POA: Diagnosis not present

## 2018-08-20 DIAGNOSIS — R410 Disorientation, unspecified: Secondary | ICD-10-CM | POA: Diagnosis not present

## 2018-08-20 DIAGNOSIS — R42 Dizziness and giddiness: Secondary | ICD-10-CM | POA: Diagnosis not present

## 2018-08-20 DIAGNOSIS — R404 Transient alteration of awareness: Secondary | ICD-10-CM | POA: Diagnosis not present
